# Patient Record
Sex: Female | Born: 1995 | Hispanic: No | Marital: Married | Smoking: Never smoker
Health system: Southern US, Community
[De-identification: ages and names within clinical notes are randomized; demographics above are authoritative.]

## PROBLEM LIST (undated history)

## (undated) ENCOUNTER — Inpatient Hospital Stay (HOSPITAL_COMMUNITY): Payer: Self-pay

## (undated) DIAGNOSIS — Z789 Other specified health status: Secondary | ICD-10-CM

## (undated) HISTORY — DX: Other specified health status: Z78.9

## (undated) HISTORY — PX: TONSILLECTOMY: SUR1361

---

## 2018-04-06 NOTE — L&D Delivery Note (Signed)
OB/GYN Faculty Practice Delivery Note  Marissa Reed is a 23 y.o. G1P0 s/p VD at [redacted]w[redacted]d. She was admitted for SOL/SROM with meconium.   ROM: 10h 44m with meconium-stained fluid GBS Status: Negative/-- (11/03 0000) Maximum Maternal Temperature: 98.26F  Labor Progress: . Initial SVE: 3/80/-2. Patient received Pitocin and epidural. She then progressed to complete.   Delivery Date/Time: 11/27 @ 1616 Delivery: Called to room and patient was complete and pushing. Head delivered in ROA position. No nuchal cord present. Shoulder and body delivered in usual fashion. Infant with spontaneous cry, placed on mother's abdomen, dried and stimulated. Cord clamped x 2 after 1-minute delay, and cut by FOB. Cord blood drawn. Placenta delivered spontaneously with gentle cord traction. Fundus firm with massage and Pitocin. Labia, perineum, vagina, and cervix inspected inspected with 3a perineal laceration which was repaired in a standard fashion. Dr. Rip Harbour called to assist with repair.  Baby Weight: pending  Placenta: Sent to L&D Complications: None Lacerations: 3a perineal laceration EBL: 620 mL Analgesia: Epidural   Infant: APGAR (1 MIN): 8   APGAR (5 MINS): 7   APGAR (10 MINS): 9     Barrington Ellison, MD [facility] Family Medicine Fellow, [facility] for [facility] [place] [facility] 03/03/2019, 4:59 PM

## 2018-07-18 ENCOUNTER — Telehealth: Payer: Self-pay | Admitting: General Practice

## 2018-07-18 DIAGNOSIS — O219 Vomiting of pregnancy, unspecified: Secondary | ICD-10-CM

## 2018-07-18 MED ORDER — PROMETHAZINE HCL 25 MG PO TABS: 25.0000 mg | ORAL_TABLET | Freq: Four times a day (QID) | ORAL | 0 refills | Status: DC | PRN

## 2018-07-18 NOTE — Telephone Encounter (Signed)
Patient's sister called in for the patient as she speaks arabic. She states her sister is pregnant for the first time and is currently  7 weeks. She reports her sister has a lot of nausea/vomiting and is unable to keep anything down. Phenergan sent in per protocol & directions of use provided. She verbalized understanding & had no questions.

## 2018-08-08 ENCOUNTER — Encounter: Payer: Self-pay | Admitting: Advanced Practice Midwife

## 2018-08-08 ENCOUNTER — Other Ambulatory Visit: Payer: Self-pay

## 2018-08-08 ENCOUNTER — Ambulatory Visit (INDEPENDENT_AMBULATORY_CARE_PROVIDER_SITE_OTHER): Payer: Medicaid Other | Admitting: Advanced Practice Midwife

## 2018-08-08 ENCOUNTER — Other Ambulatory Visit (HOSPITAL_COMMUNITY)
Admission: RE | Admit: 2018-08-08 | Discharge: 2018-08-08 | Disposition: A | Discharge status: Home / Self Care | Payer: Medicaid Other | Source: Ambulatory Visit | Attending: Advanced Practice Midwife | Admitting: Advanced Practice Midwife

## 2018-08-08 VITALS — BP 108/75 | HR 103 | Ht 68.0 in | Wt 199.7 lb

## 2018-08-08 DIAGNOSIS — Z3401 Encounter for supervision of normal first pregnancy, first trimester: Secondary | ICD-10-CM

## 2018-08-08 DIAGNOSIS — Z3A10 10 weeks gestation of pregnancy: Secondary | ICD-10-CM

## 2018-08-08 DIAGNOSIS — Z603 Acculturation difficulty: Secondary | ICD-10-CM | POA: Insufficient documentation

## 2018-08-08 DIAGNOSIS — O219 Vomiting of pregnancy, unspecified: Secondary | ICD-10-CM

## 2018-08-08 DIAGNOSIS — Z3490 Encounter for supervision of normal pregnancy, unspecified, unspecified trimester: Secondary | ICD-10-CM | POA: Insufficient documentation

## 2018-08-08 DIAGNOSIS — Z789 Other specified health status: Secondary | ICD-10-CM

## 2018-08-08 MED ORDER — DOXYLAMINE-PYRIDOXINE 10-10 MG PO TBEC: DELAYED_RELEASE_TABLET | ORAL | 5 refills | Status: DC

## 2018-08-08 NOTE — Patient Instructions (Signed)

## 2018-08-08 NOTE — Progress Notes (Signed)
Pt is here for initial OB visit. LMP 05/26/2018. EDD 03/02/19.

## 2018-08-08 NOTE — Progress Notes (Signed)
Subjective:   Marissa Reed is a 23 y.o. G1P0 at 8543w4d by LMP being seen today for her first obstetrical visit.  Her obstetrical history is significant for none and has Encounter for supervision of normal first pregnancy in first trimester on their problem list.. Patient does intend to breast feed. Pregnancy history fully reviewed.  Patient reports nausea.  HISTORY: OB History  Gravida Para Term Preterm AB Living  1 0 0 0 0 0  SAB TAB Ectopic Multiple Live Births  0 0 0 0 0    # Outcome Date GA Lbr Len/2nd Weight Sex Delivery Anes PTL Lv  1 Current            History reviewed. No pertinent past medical history. Past Surgical History:  Procedure Laterality Date  . TONSILLECTOMY     Family History  Problem Relation Age of Onset  . Diabetes Mother   . Diabetes Father    Social History   Tobacco Use  . Smoking status: Never Smoker  Substance Use Topics  . Alcohol use: Never    Frequency: Never  . Drug use: Never   Allergies not on file Current Outpatient Medications on File Prior to Visit  Medication Sig Dispense Refill  . promethazine (PHENERGAN) 25 MG tablet Take 1 tablet (25 mg total) by mouth every 6 (six) hours as needed for nausea or vomiting. 30 tablet 0   No current facility-administered medications on file prior to visit.      Exam   Vitals:   08/08/18 0909 08/08/18 0910  BP: 108/75   Pulse: (!) 103   Weight: 90.6 kg   Height:  5\' 8"  (1.727 m)   Fetal Heart Rate (bpm): 138  Uterus:     Pelvic Exam: Perineum: no hemorrhoids, normal perineum   Vulva: normal external genitalia, no lesions   Vagina:  normal mucosa, normal discharge   Cervix: no lesions and normal, pap smear done.    Adnexa: normal adnexa and no mass, fullness, tenderness   Bony Pelvis: average  System: General: well-developed, well-nourished female in no acute distress   Breast:  normal appearance, no masses or tenderness   Skin: normal coloration and turgor, no rashes   Neurologic: oriented, normal, negative, normal mood   Extremities: normal strength, tone, and muscle mass, ROM of all joints is normal   HEENT PERRLA, extraocular movement intact and sclera clear, anicteric   Mouth/Teeth mucous membranes moist, pharynx normal without lesions and dental hygiene good   Neck supple and no masses   Cardiovascular: regular rate and rhythm   Respiratory:  no respiratory distress, normal breath sounds   Abdomen: soft, non-tender; bowel sounds normal; no masses,  no organomegaly     Assessment:   Pregnancy: G1P0 Patient Active Problem List   Diagnosis Date Noted  . Encounter for supervision of normal first pregnancy in first trimester 08/08/2018     Plan:   1. Encounter for supervision of normal first pregnancy in first trimester --Anticipatory guidance about next visits/weeks of pregnancy given. --Reviewed safety, visitor policy, reassurance about COVID-19 for pregnancy at this time. Discussed possible changes to visits, including televisits, that may occur due to COVID-19.  The office remains open if pt needs to be seen and MAU is open 24 hours/day for OB emergencies.    - Babyscripts Schedule Optimization - Obstetric Panel, Including HIV - Culture, OB Urine - Genetic Screening - Cervicovaginal ancillary only( [place]) - Cytology - PAP( [place])  2.  Language barrier affecting health care --Arabic interpreter present with pt for visit today  3. Nausea and vomiting during pregnancy prior to [redacted] weeks gestation --Mostly in the mornings.  Discussed dietary changes.   - Doxylamine-Pyridoxine (DICLEGIS) 10-10 MG TBEC; Take 2 tabs at bedtime. If needed, add another tab in the morning. If needed, add another tab in the afternoon, up to 4 tabs/day.  Dispense: 100 tablet; Refill: 5   Initial labs drawn. Continue prenatal vitamins. Discussed and offered genetic screening options, including Quad screen/AFP, NIPS testing, and option to decline testing.  Benefits/risks/alternatives reviewed. Pt aware that anatomy US is form of genetic screening with lower accuracy in detecting trisomies than blood work.  Pt chooses genetic screening today. NIPS: ordered. Ultrasound discussed; fetal anatomic survey: requested. Problem list reviewed and updated. The nature of [facility] Faculty Practice with multiple MDs and other Advanced Practice Providers was explained to patient; also emphasized that residents, students are part of our team. Routine obstetric precautions reviewed. Return in about 4 weeks (around 09/05/2018).   Sharen Counter, CNM 08/08/18 9:34 AM

## 2018-08-09 LAB — OBSTETRIC PANEL, INCLUDING HIV
Antibody Screen: NEGATIVE
Basophils Absolute: 0 10*3/uL (ref 0.0–0.2)
Basos: 1 %
EOS (ABSOLUTE): 0 10*3/uL (ref 0.0–0.4)
Eos: 1 %
HIV Screen 4th Generation wRfx: NONREACTIVE
Hematocrit: 37.9 % (ref 34.0–46.6)
Hemoglobin: 12.9 g/dL (ref 11.1–15.9)
Hepatitis B Surface Ag: NEGATIVE
Immature Grans (Abs): 0 10*3/uL (ref 0.0–0.1)
Immature Granulocytes: 0 %
Lymphocytes Absolute: 2.2 10*3/uL (ref 0.7–3.1)
Lymphs: 33 %
MCH: 30.5 pg (ref 26.6–33.0)
MCHC: 34 g/dL (ref 31.5–35.7)
MCV: 90 fL (ref 79–97)
Monocytes Absolute: 0.6 10*3/uL (ref 0.1–0.9)
Monocytes: 9 %
Neutrophils Absolute: 3.8 10*3/uL (ref 1.4–7.0)
Neutrophils: 56 %
Platelets: 365 10*3/uL (ref 150–450)
RBC: 4.23 x10E6/uL (ref 3.77–5.28)
RDW: 14.1 % (ref 11.7–15.4)
RPR Ser Ql: NONREACTIVE
Rh Factor: POSITIVE
Rubella Antibodies, IGG: 4.13 index (ref >0.99)
WBC: 6.7 10*3/uL (ref 3.4–10.8)

## 2018-08-09 LAB — CERVICOVAGINAL ANCILLARY ONLY
Bacterial vaginitis: NEGATIVE
Candida vaginitis: NEGATIVE
Chlamydia: NEGATIVE
Neisseria Gonorrhea: NEGATIVE
Trichomonas: NEGATIVE

## 2018-08-09 LAB — CYTOLOGY - PAP

## 2018-08-10 LAB — CULTURE, OB URINE

## 2018-08-10 LAB — URINE CULTURE, OB REFLEX

## 2018-08-11 ENCOUNTER — Encounter: Payer: Self-pay | Admitting: Advanced Practice Midwife

## 2018-08-12 ENCOUNTER — Encounter: Payer: Self-pay | Admitting: Advanced Practice Midwife

## 2018-08-15 ENCOUNTER — Encounter: Payer: Self-pay | Admitting: Advanced Practice Midwife

## 2018-08-16 ENCOUNTER — Encounter: Payer: Self-pay | Admitting: Advanced Practice Midwife

## 2018-08-20 ENCOUNTER — Encounter: Payer: Self-pay | Admitting: Advanced Practice Midwife

## 2018-09-05 ENCOUNTER — Other Ambulatory Visit: Payer: Self-pay

## 2018-09-05 ENCOUNTER — Ambulatory Visit (INDEPENDENT_AMBULATORY_CARE_PROVIDER_SITE_OTHER): Payer: Medicaid Other | Admitting: Advanced Practice Midwife

## 2018-09-05 VITALS — BP 91/59

## 2018-09-05 DIAGNOSIS — Z3A14 14 weeks gestation of pregnancy: Secondary | ICD-10-CM

## 2018-09-05 DIAGNOSIS — Z3402 Encounter for supervision of normal first pregnancy, second trimester: Secondary | ICD-10-CM

## 2018-09-05 DIAGNOSIS — Z789 Other specified health status: Secondary | ICD-10-CM

## 2018-09-05 DIAGNOSIS — Z3401 Encounter for supervision of normal first pregnancy, first trimester: Secondary | ICD-10-CM

## 2018-09-05 NOTE — Progress Notes (Signed)
Pt is on the phone preparing for televisit with provider.

## 2018-09-05 NOTE — Progress Notes (Signed)
   TELEHEALTH VIRTUAL OBSTETRICS VISIT ENCOUNTER NOTE  I connected with Marissa Reed on 09/05/18 at  1:15 PM EDT by telephone at home and verified that I am speaking with the correct person using two identifiers.   I discussed the limitations, risks, security and privacy concerns of performing an evaluation and management service by telephone and the availability of in person appointments. I also discussed with the patient that there may be a patient responsible charge related to this service. The patient expressed understanding and agreed to proceed.  Subjective:  Marissa Reed is a 23 y.o. G1P0 at [redacted]w[redacted]d being followed for ongoing prenatal care.  She is currently monitored for the following issues for this low-risk pregnancy and has Encounter for supervision of normal first pregnancy in first trimester and Language barrier affecting health care on their problem list.  Patient reports no complaints. Reports fetal movement. Denies any contractions, bleeding or leaking of fluid.   The following portions of the patient's history were reviewed and updated as appropriate: allergies, current medications, past family history, past medical history, past social history, past surgical history and problem list.   Objective:   General:  Alert, oriented and cooperative.   Mental Status: Normal mood and affect perceived. Normal judgment and thought content.  Rest of physical exam deferred due to type of encounter  Assessment and Plan:  Pregnancy: G1P0 at [redacted]w[redacted]d 1. Encounter for supervision of normal first pregnancy in first trimester --Pt denies cramping, LOF, or vaginal bleeding --Anticipatory guidance about next visits/weeks of pregnancy given. --Pt is doing well but prefers another visit in the office soon for reassurance.  [place] and pt next prenatal visit to be in person visits. Pt states understanding.   --Pt with questions about low BP, she denies any dizziness or other symptoms. Reassurance provided  that 90s/50s can be normal in early pregnancy but to let us know if she becomes symptomatic. --Answered questions about PNV, importance of iron, folic acid, and DHA.  Also discussed importance of healthy balanced diet. Pt states understanding.   - [place] MFM OB COMP + 14 WK; Future  2. Language barrier affecting health care --Arabic interpreter used.  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  Preferred to remain on phone for today's visit and not use video conference. Will consider video visits in the future.  Preterm labor symptoms and general obstetric precautions including but not limited to vaginal bleeding, contractions, leaking of fluid and fetal movement were reviewed in detail with the patient.  I discussed the assessment and treatment plan with the patient. The patient was provided an opportunity to ask questions and all were answered. The patient agreed with the plan and demonstrated an understanding of the instructions. The patient was advised to call back or seek an in-person office evaluation/go to MAU at [facility] for any urgent or concerning symptoms. Please refer to After Visit Summary for other counseling recommendations.   I provided 15 minutes of non-face-to-face time during this encounter.  Return in about 5 weeks (around 10/10/2018).  No future appointments.  Sharen Counter, CNM Center for [employer], [facility]

## 2018-09-27 ENCOUNTER — Other Ambulatory Visit: Payer: Self-pay

## 2018-09-27 ENCOUNTER — Ambulatory Visit (INDEPENDENT_AMBULATORY_CARE_PROVIDER_SITE_OTHER): Payer: Medicaid Other

## 2018-09-27 VITALS — BP 91/65 | HR 108 | Wt 203.0 lb

## 2018-09-27 DIAGNOSIS — Z3401 Encounter for supervision of normal first pregnancy, first trimester: Secondary | ICD-10-CM

## 2018-09-27 NOTE — Progress Notes (Signed)
Presented for Blood Pressure Check and AFP; CBC Labs.  Subjective:  Marissa Reed is a 23 y.o. female here for BP check.   Hypertension ROS: taking medications as instructed, no medication side effects noted, no TIA's, no chest pain on exertion, no dyspnea on exertion and no swelling of ankles.    Objective:  BP 91/65   Pulse (!) 108   Wt 203 lb (92.1 kg)   LMP 05/26/2018 (Exact Date)   BMI 30.87 kg/m   Appearance alert, well appearing, and in no distress. General exam BP noted to be well controlled today in office.    Assessment:   Blood Pressure well controlled.   Plan:  Current treatment plan is effective, no change in therapy.per Dr. Jodi Mourning.

## 2018-09-29 LAB — CBC
Hematocrit: 35.7 % (ref 34.0–46.6)
Hemoglobin: 12.1 g/dL (ref 11.1–15.9)
MCH: 30.4 pg (ref 26.6–33.0)
MCHC: 33.9 g/dL (ref 31.5–35.7)
MCV: 90 fL (ref 79–97)
Platelets: 311 10*3/uL (ref 150–450)
RBC: 3.98 x10E6/uL (ref 3.77–5.28)
RDW: 14.2 % (ref 11.7–15.4)
WBC: 6.6 10*3/uL (ref 3.4–10.8)

## 2018-09-29 LAB — AFP, SERUM, OPEN SPINA BIFIDA
AFP MoM: 1.08
AFP Value: 35.5 ng/mL
Gest. Age on Collection Date: 17.5 weeks
Maternal Age At EDD: 21.6 yr
OSBR Risk 1 IN: 9540
Test Results:: NEGATIVE
Weight: 203 [lb_av]

## 2018-10-06 ENCOUNTER — Ambulatory Visit (HOSPITAL_COMMUNITY): Payer: Medicaid Other

## 2018-10-10 ENCOUNTER — Ambulatory Visit (INDEPENDENT_AMBULATORY_CARE_PROVIDER_SITE_OTHER): Payer: Medicaid Other | Admitting: Obstetrics & Gynecology

## 2018-10-10 ENCOUNTER — Encounter: Payer: Self-pay | Admitting: Obstetrics & Gynecology

## 2018-10-10 ENCOUNTER — Other Ambulatory Visit: Payer: Self-pay

## 2018-10-10 ENCOUNTER — Ambulatory Visit (HOSPITAL_COMMUNITY): Payer: Medicaid Other

## 2018-10-10 DIAGNOSIS — Z3A19 19 weeks gestation of pregnancy: Secondary | ICD-10-CM

## 2018-10-10 DIAGNOSIS — Z3402 Encounter for supervision of normal first pregnancy, second trimester: Secondary | ICD-10-CM

## 2018-10-10 NOTE — Patient Instructions (Signed)
Return to office for any scheduled appointments. Call the office or go to the [facility] at [facility] [place] if:  You begin to have strong, frequent contractions  Your water breaks.  Sometimes it is a big gush of fluid, sometimes it is just a trickle that keeps getting your panties wet or running down your legs  You have vaginal bleeding.  It is normal to have a small amount of spotting if your cervix was checked.   You do not feel your baby moving like normal.  If you do not, get something to eat and drink and lay down and focus on feeling your baby move.   If your baby is still not moving like normal, you should call the office or go to MAU.  Any other obstetric concerns.   

## 2018-10-10 NOTE — Progress Notes (Signed)
    TELEHEALTH VIRTUAL OBSTETRICS VISIT ENCOUNTER NOTE  I connected with Marissa Reed on 10/10/18 at 10:00 AM EDT by telephone at home and verified that I am speaking with the correct person using two identifiers. Due to language barrier, an Arabic phone interpreter was used during this encounter.  Patient was unable to do a video visit given that interpreter could not connect to WebEx.    I discussed the limitations, risks, security and privacy concerns of performing an evaluation and management service by telephone and the availability of in person appointments. I also discussed with the patient that there may be a patient responsible charge related to this service. The patient expressed understanding and agreed to proceed.  Subjective:  Marissa Reed is a 23 y.o. G1P0 at [redacted]w[redacted]d being followed for ongoing prenatal care.  She is currently monitored for the following issues for this low-risk pregnancy and has Supervision of normal pregnancy and Language barrier affecting health care on their problem list.  Patient reports being very worried that she cannot feel her baby move. Anatomy scan is on 10/21/18. She is really concerned . Reports fetal movement. Denies any contractions, bleeding or leaking of fluid.   The following portions of the patient's history were reviewed and updated as appropriate: allergies, current medications, past family history, past medical history, past social history, past surgical history and problem list.   Objective:   General:  Alert, oriented and cooperative.   Mental Status: Normal mood and affect perceived. Normal judgment and thought content.  Rest of physical exam deferred due to type of encounter  Assessment and Plan:  Pregnancy: G1P0 at [redacted]w[redacted]d 1. Encounter for supervision of normal first pregnancy in second trimester Reviewed all initial OB labs, patient reassured. Repeat pap needed later in pregnancy or postpartum given unsatisfactory specimen. Reassured  patient that she was still early and may not feel FM yet, but she was very concerned. Offered for her to come in for [facility] check for reassurance, she wants to come in tomorrow monring. RN appointment made for her at [place]. Patient was very grateful for this. No other complaints or concerns.  Routine obstetric precautions reviewed.  I discussed the assessment and treatment plan with the patient. The patient was provided an opportunity to ask questions and all were answered. The patient agreed with the plan and demonstrated an understanding of the instructions. The patient was advised to call back or seek an in-person office evaluation/go to MAU at [facility] for any urgent or concerning symptoms. Please refer to After Visit Summary for other counseling recommendations.   I provided 15 minutes of non-face-to-face time during this encounter.  Return in about 4 weeks (around 11/07/2018) for Virtual OB Visit (WebEx, needs Arabic interpreter) *coming in 10/11/18 for RN FHR check via doppler* .  Future Appointments  Date Time Provider [place]  10/11/2018  8:45 AM [place] None  10/21/2018  3:15 PM [place]    Verita Schneiders, MD Center for [place], [place]

## 2018-10-10 NOTE — Progress Notes (Signed)
Virtual Visit via Telephone Note  I connected with Marissa Reed on 10/10/18 at 10:00 AM EDT by telephone and verified that I am speaking with the correct person using two identifiers. Used translator

## 2018-10-11 ENCOUNTER — Other Ambulatory Visit: Payer: Self-pay

## 2018-10-11 ENCOUNTER — Ambulatory Visit (INDEPENDENT_AMBULATORY_CARE_PROVIDER_SITE_OTHER): Payer: Medicaid Other

## 2018-10-11 DIAGNOSIS — Z3492 Encounter for supervision of normal pregnancy, unspecified, second trimester: Secondary | ICD-10-CM

## 2018-10-11 DIAGNOSIS — Z3490 Encounter for supervision of normal pregnancy, unspecified, unspecified trimester: Secondary | ICD-10-CM

## 2018-10-11 NOTE — Progress Notes (Signed)
Pt is here for FHR doppler for reassurance. She reported she does not feel fetal movement during her virtual visit yesterday with the provider, although the provider reassured her this can be normal at [redacted] weeks gestation- the patient felt more comfortable hearing the FHR in office. FHR today was 150, pt is reassured.

## 2018-10-21 ENCOUNTER — Ambulatory Visit (HOSPITAL_COMMUNITY): Payer: Medicaid Other

## 2018-10-25 ENCOUNTER — Ambulatory Visit (HOSPITAL_COMMUNITY)
Admission: RE | Admit: 2018-10-25 | Discharge: 2018-10-25 | Disposition: A | Discharge status: Home / Self Care | Payer: Medicaid Other | Source: Ambulatory Visit | Attending: Obstetrics and Gynecology | Admitting: Obstetrics and Gynecology

## 2018-10-25 ENCOUNTER — Other Ambulatory Visit (HOSPITAL_COMMUNITY): Payer: Self-pay | Admitting: *Deleted

## 2018-10-25 ENCOUNTER — Other Ambulatory Visit: Payer: Self-pay

## 2018-10-25 DIAGNOSIS — Z3A21 21 weeks gestation of pregnancy: Secondary | ICD-10-CM

## 2018-10-25 DIAGNOSIS — Z3401 Encounter for supervision of normal first pregnancy, first trimester: Secondary | ICD-10-CM

## 2018-10-25 DIAGNOSIS — Z363 Encounter for antenatal screening for malformations: Secondary | ICD-10-CM

## 2018-10-25 DIAGNOSIS — Z362 Encounter for other antenatal screening follow-up: Secondary | ICD-10-CM

## 2018-10-27 NOTE — Progress Notes (Signed)
Patient ID: Marissa Reed, female   DOB: 11/22/1995, 24 y.o.   MRN: [id number] Patient seen and assessed by nursing staff during this encounter. I have reviewed the chart and agree with the documentation and plan.  Emeterio Reeve, MD 10/27/2018 11:09 AM

## 2018-11-07 ENCOUNTER — Ambulatory Visit (INDEPENDENT_AMBULATORY_CARE_PROVIDER_SITE_OTHER): Payer: Medicaid Other | Admitting: Obstetrics and Gynecology

## 2018-11-07 ENCOUNTER — Encounter: Payer: Self-pay | Admitting: Obstetrics and Gynecology

## 2018-11-07 DIAGNOSIS — Z3492 Encounter for supervision of normal pregnancy, unspecified, second trimester: Secondary | ICD-10-CM

## 2018-11-07 DIAGNOSIS — Z603 Acculturation difficulty: Secondary | ICD-10-CM

## 2018-11-07 DIAGNOSIS — Z3A23 23 weeks gestation of pregnancy: Secondary | ICD-10-CM

## 2018-11-07 DIAGNOSIS — Z789 Other specified health status: Secondary | ICD-10-CM

## 2018-11-07 DIAGNOSIS — Z3490 Encounter for supervision of normal pregnancy, unspecified, unspecified trimester: Secondary | ICD-10-CM

## 2018-11-07 NOTE — Progress Notes (Signed)
S/w pt to prepare for virtual visit. Pt reports fetal movement, denies pain. Pt states that BP cuff needs batteries and she is unable to take it today.

## 2018-11-07 NOTE — Progress Notes (Signed)
   [place] VIRTUAL VIDEO VISIT ENCOUNTER NOTE  Provider location: Center for [place] at [place]   I connected with Charlyne Quale on 11/07/18 at 10:15 AM EDT by WebEx Video Encounter at home and verified that I am speaking with the correct person using two identifiers.   I discussed the limitations, risks, security and privacy concerns of performing an evaluation and management service virtually and the availability of in person appointments. I also discussed with the patient that there may be a patient responsible charge related to this service. The patient expressed understanding and agreed to proceed. Subjective:  Tonni Mansour is a 23 y.o. G1P0 at [redacted]w[redacted]d being seen today for ongoing prenatal care.  She is currently monitored for the following issues for this low-risk pregnancy and has Supervision of normal pregnancy and Language barrier affecting health care on their problem list.  Patient reports no complaints.  Contractions: Not present. Vag. Bleeding: None.  Movement: Present. Denies any leaking of fluid.   The following portions of the patient's history were reviewed and updated as appropriate: allergies, current medications, past family history, past medical history, past social history, past surgical history and problem list.   Objective:  There were no vitals filed for this visit.  Fetal Status:     Movement: Present     General:  Alert, oriented and cooperative. Patient is in no acute distress.  Respiratory: Normal respiratory effort, no problems with respiration noted  Mental Status: Normal mood and affect. Normal behavior. Normal judgment and thought content.  Rest of physical exam deferred due to type of encounter  Assessment and Plan:  Pregnancy: G1P0 at [redacted]w[redacted]d  1. Encounter for supervision of normal pregnancy, antepartum, unspecified gravidity - Reviewed anatomy US, has follow up 4 weeks for subotipmal views - Next visit 2 hr GTT  2.  Language barrier affecting health care Arabic translator used   Preterm labor symptoms and general obstetric precautions including but not limited to vaginal bleeding, contractions, leaking of fluid and fetal movement were reviewed in detail with the patient. I discussed the assessment and treatment plan with the patient. The patient was provided an opportunity to ask questions and all were answered. The patient agreed with the plan and demonstrated an understanding of the instructions. The patient was advised to call back or seek an in-person office evaluation/go to MAU at [facility] for any urgent or concerning symptoms. Please refer to After Visit Summary for other counseling recommendations.   I provided 15 minutes of face-to-face time during this encounter.  Return in about 4 weeks (around 12/05/2018) for 2 hr GTT, 3rd trim labs, OB visit, in person.  Future Appointments  Date Time Provider [place]  11/22/2018 10:45 AM [facility] [place] 2 [facility]-MFCUS M[facility]-US    Geraldyne Barraclough M Richardine Peppers, [place] for [place], [place]

## 2018-11-17 ENCOUNTER — Other Ambulatory Visit: Payer: Self-pay

## 2018-11-17 ENCOUNTER — Inpatient Hospital Stay (HOSPITAL_COMMUNITY)
Admission: AD | Admit: 2018-11-17 | Discharge: 2018-11-17 | Disposition: A | Discharge status: Home / Self Care | Payer: Medicaid Other | Attending: Obstetrics & Gynecology | Admitting: Obstetrics & Gynecology

## 2018-11-17 ENCOUNTER — Encounter (HOSPITAL_COMMUNITY): Payer: Self-pay

## 2018-11-17 ENCOUNTER — Telehealth: Payer: Self-pay | Admitting: *Deleted

## 2018-11-17 DIAGNOSIS — O368130 Decreased fetal movements, third trimester, not applicable or unspecified: Secondary | ICD-10-CM

## 2018-11-17 DIAGNOSIS — O368120 Decreased fetal movements, second trimester, not applicable or unspecified: Secondary | ICD-10-CM | POA: Insufficient documentation

## 2018-11-17 DIAGNOSIS — Z3689 Encounter for other specified antenatal screening: Secondary | ICD-10-CM | POA: Diagnosis not present

## 2018-11-17 DIAGNOSIS — Z3A25 25 weeks gestation of pregnancy: Secondary | ICD-10-CM | POA: Insufficient documentation

## 2018-11-17 DIAGNOSIS — Z3490 Encounter for supervision of normal pregnancy, unspecified, unspecified trimester: Secondary | ICD-10-CM

## 2018-11-17 NOTE — Telephone Encounter (Signed)
Telephone call to patient using [facility] # [phone].  Patient states she has not felt baby move since last night.  I advised patient to go directly to the [facility] and [place] to be evaluated.  Patient stated she did not have a ride right now.  Emphasized the importance that she be evaluated as soon as possible.

## 2018-11-17 NOTE — Discharge Instructions (Signed)

## 2018-11-17 NOTE — MAU Note (Signed)
Reports decreased fetal movement last 2 days.  Denies vag bleeding, leaking or any pain.

## 2018-11-17 NOTE — MAU Provider Note (Signed)
History     CSN: [id number]  Arrival date and time: 11/17/18 1310   First Provider Initiated Contact with Patient 11/17/18 1345      Chief Complaint  Patient presents with  . Decreased Fetal Movement   HPI  Ms. Nydia BoutonSalma Girardot is a 23 y.o. G1P0 at 64110w0d who presents to MAU today with complaint of decreased fetal movement x 2 days. The patient states that she is still feeling some movement every day. She denies contractions, vaginal bleeding, LOF or complications with the pregnancy.    OB History    Gravida  1   Para      Term      Preterm      AB      Living        SAB      TAB      Ectopic      Multiple      Live Births              No past medical history on file.  Past Surgical History:  Procedure Laterality Date  . TONSILLECTOMY      Family History  Problem Relation Age of Onset  . Diabetes Mother   . Diabetes Father     Social History   Tobacco Use  . Smoking status: Never Smoker  . Smokeless tobacco: Never Used  Substance Use Topics  . Alcohol use: Never    Frequency: Never  . Drug use: Never    Allergies: No Known Allergies  Medications Prior to Admission  Medication Sig Dispense Refill Last Dose  . Prenatal Vit w/Fe-Methylfol-FA (PNV PO) Take by mouth.   11/16/2018 at Unknown time  . Doxylamine-Pyridoxine (DICLEGIS) 10-10 MG TBEC Take 2 tabs at bedtime. If needed, add another tab in the morning. If needed, add another tab in the afternoon, up to 4 tabs/day. 100 tablet 5   . promethazine (PHENERGAN) 25 MG tablet Take 1 tablet (25 mg total) by mouth every 6 (six) hours as needed for nausea or vomiting. (Patient not taking: Reported on 11/07/2018) 30 tablet 0     Review of Systems  Constitutional: Negative for fever.  Gastrointestinal: Negative for abdominal pain.  Genitourinary: Negative for vaginal bleeding and vaginal discharge.   Physical Exam   Blood pressure 109/69, pulse 95, temperature 99.4 F (37.4 C), temperature source  Oral, resp. rate 18, last menstrual period 05/26/2018, SpO2 99 %.  Physical Exam  Nursing note and vitals reviewed. Constitutional: She is oriented to person, place, and time. She appears well-developed and well-nourished. No distress.  HENT:  Head: Normocephalic and atraumatic.  Cardiovascular: Normal rate.  Respiratory: Effort normal.  GI: Soft. She exhibits no distension and no mass. There is no abdominal tenderness. There is no rebound and no guarding.  Neurological: She is alert and oriented to person, place, and time.  Skin: Skin is warm and dry. No erythema.  Psychiatric: She has a normal mood and affect.     Fetal Monitoring: Baseline: 140 bpm Variability: moderate Accelerations: 10 x 10 Decelerations: none Contractions: none   MAU Course  Procedures None   MDM NST today - reassuring for GA Patient is reassured of normal FM pattern for this GA and acknowledges feeling normal movement while in MAU Assessment and Plan  A: SIUP at 39110w0d Normal fetal movement NST reassuring for GA    P: Discharge home Warning signs for worsening condition discussed Patient advised to follow-up with [facility] as scheduled for routine prenatal  care  Patient may return to MAU as needed or if her condition were to change or worsen  Kerry Hough, PA-C 11/17/2018, 1:52 PM

## 2018-11-22 ENCOUNTER — Ambulatory Visit (HOSPITAL_COMMUNITY)
Admission: RE | Admit: 2018-11-22 | Discharge: 2018-11-22 | Disposition: A | Discharge status: Home / Self Care | Payer: Medicaid Other | Source: Ambulatory Visit | Attending: Maternal & Fetal Medicine | Admitting: Maternal & Fetal Medicine

## 2018-11-30 ENCOUNTER — Ambulatory Visit (HOSPITAL_COMMUNITY)
Admission: RE | Admit: 2018-11-30 | Discharge: 2018-11-30 | Disposition: A | Discharge status: Home / Self Care | Payer: Medicaid Other | Source: Ambulatory Visit | Attending: Maternal & Fetal Medicine | Admitting: Maternal & Fetal Medicine

## 2018-11-30 ENCOUNTER — Other Ambulatory Visit: Payer: Self-pay

## 2018-11-30 DIAGNOSIS — Z362 Encounter for other antenatal screening follow-up: Secondary | ICD-10-CM | POA: Diagnosis present

## 2018-11-30 DIAGNOSIS — Z3A26 26 weeks gestation of pregnancy: Secondary | ICD-10-CM | POA: Diagnosis not present

## 2018-12-07 ENCOUNTER — Other Ambulatory Visit: Payer: Medicaid Other

## 2018-12-07 ENCOUNTER — Encounter: Payer: Medicaid Other | Admitting: Certified Nurse Midwife

## 2018-12-16 ENCOUNTER — Other Ambulatory Visit: Payer: Medicaid Other

## 2018-12-16 ENCOUNTER — Encounter: Payer: Self-pay | Admitting: Obstetrics

## 2018-12-16 ENCOUNTER — Other Ambulatory Visit: Payer: Self-pay

## 2018-12-16 ENCOUNTER — Encounter: Payer: Self-pay | Admitting: Nurse Practitioner

## 2018-12-16 ENCOUNTER — Ambulatory Visit (INDEPENDENT_AMBULATORY_CARE_PROVIDER_SITE_OTHER): Payer: Medicaid Other | Admitting: Nurse Practitioner

## 2018-12-16 ENCOUNTER — Encounter: Payer: Medicaid Other | Admitting: Nurse Practitioner

## 2018-12-16 DIAGNOSIS — Z3490 Encounter for supervision of normal pregnancy, unspecified, unspecified trimester: Secondary | ICD-10-CM

## 2018-12-16 DIAGNOSIS — Z3492 Encounter for supervision of normal pregnancy, unspecified, second trimester: Secondary | ICD-10-CM

## 2018-12-16 DIAGNOSIS — Z3A29 29 weeks gestation of pregnancy: Secondary | ICD-10-CM

## 2018-12-16 MED ORDER — PROMETHAZINE HCL 25 MG PO TABS: 25.0000 mg | ORAL_TABLET | Freq: Four times a day (QID) | ORAL | 0 refills | Status: DC | PRN

## 2018-12-16 NOTE — Progress Notes (Signed)
Patient reports fetal movement, denies pain. 

## 2018-12-16 NOTE — Patient Instructions (Signed)

## 2018-12-16 NOTE — Progress Notes (Signed)
    Subjective:  Marissa Reed is a 23 y.o. G1P0 at [redacted]w[redacted]d being seen today for ongoing prenatal care.  She is currently monitored for the following issues for this low-risk pregnancy and has Supervision of normal pregnancy and Language barrier affecting health care on their problem list.  Patient reports no complaints.  Contractions: Not present. Vag. Bleeding: None.  Movement: Present. Denies leaking of fluid.   The following portions of the patient's history were reviewed and updated as appropriate: allergies, current medications, past family history, past medical history, past social history, past surgical history and problem list. Problem list updated.  Objective:   Vitals:   12/16/18 0852  BP: 103/71  Pulse: 97  Weight: 224 lb 12.8 oz (102 kg)    Fetal Status: Fetal Heart Rate (bpm): 145 Fundal Height: 27 cm Movement: Present     General:  Alert, oriented and cooperative. Patient is in no acute distress.  Skin: Skin is warm and dry. No rash noted.   Cardiovascular: Normal heart rate noted  Respiratory: Normal respiratory effort, no problems with respiration noted  Abdomen: Soft, gravid, appropriate for gestational age. Pain/Pressure: Absent     Pelvic:  Cervical exam deferred        Extremities: Normal range of motion.  Edema: None  Mental Status: Normal mood and affect. Normal behavior. Normal judgment and thought content.   Urinalysis:      Assessment and Plan:  Pregnancy: G1P0 at [redacted]w[redacted]d  1. Encounter for supervision of normal pregnancy, antepartum, unspecified gravidity Clietn asked about ultrasound results - of note - EFW dropped from 51% at her eariler [place] to 26% at 26 weeks.  Additionally she has had one visit at MAU for decreased fetal movement at 25 weeks.  Baby was doing well then but with added drop in percentile, would recommend getting another ultrasound at 32 weeks - ordered  Did not complete GTT today due to vomiting.  - Glucose Tolerance, 2 Hours w/1 Hour -  RPR - CBC - HIV antibody (with reflex)  Preterm labor symptoms and general obstetric precautions including but not limited to vaginal bleeding, contractions, leaking of fluid and fetal movement were reviewed in detail with the patient. Please refer to After Visit Summary for other counseling recommendations.  Return in about 2 weeks (around 12/30/2018) for virtual visit - WebEx with interpreter.  Return sooner for 1 hour glucola and use Phenergan prescribed to avoid vomiting with the glucola.  Earlie Server, RN, MSN, NP-BC Nurse Practitioner, [facility] for [employer], [place] [employer] 12/16/2018 12:42 PM

## 2018-12-23 ENCOUNTER — Other Ambulatory Visit: Payer: Medicaid Other

## 2018-12-23 ENCOUNTER — Other Ambulatory Visit: Payer: Self-pay

## 2018-12-23 DIAGNOSIS — Z3490 Encounter for supervision of normal pregnancy, unspecified, unspecified trimester: Secondary | ICD-10-CM

## 2018-12-24 LAB — CBC
Hematocrit: 34.3 % (ref 34.0–46.6)
Hemoglobin: 11.6 g/dL (ref 11.1–15.9)
MCH: 31 pg (ref 26.6–33.0)
MCHC: 33.8 g/dL (ref 31.5–35.7)
MCV: 92 fL (ref 79–97)
Platelets: 217 10*3/uL (ref 150–450)
RBC: 3.74 x10E6/uL — ABNORMAL LOW (ref 3.77–5.28)
RDW: 12.9 % (ref 11.7–15.4)
WBC: 7.2 10*3/uL (ref 3.4–10.8)

## 2018-12-24 LAB — GLUCOSE TOLERANCE, 1 HOUR: Glucose, 1Hr PP: 146 mg/dL (ref 65–199)

## 2018-12-24 LAB — RPR: RPR Ser Ql: NONREACTIVE

## 2018-12-24 LAB — HIV ANTIBODY (ROUTINE TESTING W REFLEX): HIV Screen 4th Generation wRfx: NONREACTIVE

## 2018-12-25 ENCOUNTER — Encounter: Payer: Self-pay | Admitting: Obstetrics and Gynecology

## 2018-12-25 DIAGNOSIS — O99810 Abnormal glucose complicating pregnancy: Secondary | ICD-10-CM | POA: Insufficient documentation

## 2018-12-29 ENCOUNTER — Encounter: Payer: Self-pay | Admitting: Obstetrics

## 2018-12-29 ENCOUNTER — Other Ambulatory Visit: Payer: Medicaid Other

## 2018-12-29 ENCOUNTER — Other Ambulatory Visit: Payer: Self-pay

## 2018-12-29 DIAGNOSIS — Z3490 Encounter for supervision of normal pregnancy, unspecified, unspecified trimester: Secondary | ICD-10-CM

## 2018-12-29 NOTE — Progress Notes (Unsigned)
3

## 2018-12-30 ENCOUNTER — Telehealth: Payer: Self-pay | Admitting: Medical

## 2018-12-30 ENCOUNTER — Ambulatory Visit (INDEPENDENT_AMBULATORY_CARE_PROVIDER_SITE_OTHER): Payer: Medicaid Other | Admitting: Medical

## 2018-12-30 ENCOUNTER — Encounter: Payer: Self-pay | Admitting: Medical

## 2018-12-30 VITALS — BP 91/68 | HR 110

## 2018-12-30 DIAGNOSIS — Z3493 Encounter for supervision of normal pregnancy, unspecified, third trimester: Secondary | ICD-10-CM | POA: Diagnosis not present

## 2018-12-30 DIAGNOSIS — Z3A31 31 weeks gestation of pregnancy: Secondary | ICD-10-CM

## 2018-12-30 DIAGNOSIS — Z3490 Encounter for supervision of normal pregnancy, unspecified, unspecified trimester: Secondary | ICD-10-CM

## 2018-12-30 DIAGNOSIS — Z789 Other specified health status: Secondary | ICD-10-CM

## 2018-12-30 DIAGNOSIS — O99810 Abnormal glucose complicating pregnancy: Secondary | ICD-10-CM

## 2018-12-30 LAB — GESTATIONAL GLUCOSE TOLERANCE
Glucose, Fasting: 91 mg/dL (ref 65–94)
Glucose, GTT - 1 Hour: 160 mg/dL (ref 65–179)
Glucose, GTT - 2 Hour: 114 mg/dL (ref 65–154)
Glucose, GTT - 3 Hour: 133 mg/dL (ref 65–139)

## 2018-12-30 NOTE — Progress Notes (Signed)
3 hour pending  Will change follow-up if failed 3 hour  Needs repeat pap    TELEHEALTH VIRTUAL OBSTETRICS VISIT ENCOUNTER NOTE  I connected with Marissa Reed on 12/30/18 at  9:55 AM EDT by telephone at home and verified that I am speaking with the correct person using two identifiers.   I discussed the limitations, risks, security and privacy concerns of performing an evaluation and management service by telephone and the availability of in person appointments. I also discussed with the patient that there may be a patient responsible charge related to this service. The patient expressed understanding and agreed to proceed.  Subjective:  Marissa Reed is a 23 y.o. G1P0 at [redacted]w[redacted]d being followed for ongoing prenatal care.  She is currently monitored for the following issues for this high-risk pregnancy and has Supervision of normal pregnancy; Language barrier affecting health care; and Abnormal glucose in pregnancy, antepartum on their problem list.  Patient reports no complaints. Reports fetal movement. Denies any contractions, bleeding or leaking of fluid.   The following portions of the patient's history were reviewed and updated as appropriate: allergies, current medications, past family history, past medical history, past social history, past surgical history and problem list.   Objective:   General:  Alert, oriented and cooperative.   Mental Status: Normal mood and affect perceived. Normal judgment and thought content.  Rest of physical exam deferred due to type of encounter  Assessment and Plan:  Pregnancy: G1P0 at [redacted]w[redacted]d 1. Encounter for supervision of normal pregnancy, antepartum, unspecified gravidity - Doing well, no complaints  - Follow-up US scheduled 01/30/19  2. Language barrier affecting health care - [employer] interpreters used   3. Abnormal glucose in pregnancy, antepartum - 3 hour drawn yesterday, results pending  - Patient will be contacted with results when available  and will need to be removed from baby Rx optimized scheduling if GDM  Preterm labor symptoms and general obstetric precautions including but not limited to vaginal bleeding, contractions, leaking of fluid and fetal movement were reviewed in detail with the patient.  I discussed the assessment and treatment plan with the patient. The patient was provided an opportunity to ask questions and all were answered. The patient agreed with the plan and demonstrated an understanding of the instructions. The patient was advised to call back or seek an in-person office evaluation/go to [facility]U at [facility] for any urgent or concerning symptoms. Please refer to After Visit Summary for other counseling recommendations.   I provided 10 minutes of non-face-to-face time during this encounter.  Return in about 4 weeks (around 01/27/2019) for [place], In-Person.  Future Appointments  Date Time Provider [place]  01/30/2019 10:45 AM WH-MFC [place] 2 WH-MFCUS MFC-US    Kerry Hough, [place] for [employer], [place]

## 2018-12-30 NOTE — Telephone Encounter (Signed)
Used [employer] to call patient with normal 3 hour GTT results. Patient voiced understanding and will plan to keep upcoming appointments as previously scheduled.   Luvenia Redden, PA-C 12/30/2018 2:04 PM

## 2018-12-30 NOTE — Patient Instructions (Signed)
Braxton Hicks Contractions Contractions of the uterus can occur throughout pregnancy, but they are not always a sign that you are in labor. You may have practice contractions called Braxton Hicks contractions. These false labor contractions are sometimes confused with true labor. What are Braxton Hicks contractions? Braxton Hicks contractions are tightening movements that occur in the muscles of the uterus before labor. Unlike true labor contractions, these contractions do not result in opening (dilation) and thinning of the cervix. Toward the end of pregnancy (32-34 weeks), Braxton Hicks contractions can happen more often and may become stronger. These contractions are sometimes difficult to tell apart from true labor because they can be very uncomfortable. You should not feel embarrassed if you go to the hospital with false labor. Sometimes, the only way to tell if you are in true labor is for your health care provider to look for changes in the cervix. The health care provider will do a physical exam and may monitor your contractions. If you are not in true labor, the exam should show that your cervix is not dilating and your water has not broken. If there are no other health problems associated with your pregnancy, it is completely safe for you to be sent home with false labor. You may continue to have Braxton Hicks contractions until you go into true labor. How to tell the difference between true labor and false labor True labor  Contractions last 30-70 seconds.  Contractions become very regular.  Discomfort is usually felt in the top of the uterus, and it spreads to the lower abdomen and low back.  Contractions do not go away with walking.  Contractions usually become more intense and increase in frequency.  The cervix dilates and gets thinner. False labor  Contractions are usually shorter and not as strong as true labor contractions.  Contractions are usually irregular.  Contractions  are often felt in the front of the lower abdomen and in the groin.  Contractions may go away when you walk around or change positions while lying down.  Contractions get weaker and are shorter-lasting as time goes on.  The cervix usually does not dilate or become thin. Follow these instructions at home:   Take over-the-counter and prescription medicines only as told by your health care provider.  Keep up with your usual exercises and follow other instructions from your health care provider.  Eat and drink lightly if you think you are going into labor.  If Braxton Hicks contractions are making you uncomfortable: ? Change your position from lying down or resting to walking, or change from walking to resting. ? Sit and rest in a tub of warm water. ? Drink enough fluid to keep your urine pale yellow. Dehydration may cause these contractions. ? Do slow and deep breathing several times an hour.  Keep all follow-up prenatal visits as told by your health care provider. This is important. Contact a health care provider if:  You have a fever.  You have continuous pain in your abdomen. Get help right away if:  Your contractions become stronger, more regular, and closer together.  You have fluid leaking or gushing from your vagina.  You pass blood-tinged mucus (bloody show).  You have bleeding from your vagina.  You have low back pain that you never had before.  You feel your baby's head pushing down and causing pelvic pressure.  Your baby is not moving inside you as much as it used to. Summary  Contractions that occur before labor are   called Braxton Hicks contractions, false labor, or practice contractions.  Braxton Hicks contractions are usually shorter, weaker, farther apart, and less regular than true labor contractions. True labor contractions usually become progressively stronger and regular, and they become more frequent.  Manage discomfort from Braxton Hicks contractions  by changing position, resting in a warm bath, drinking plenty of water, or practicing deep breathing. This information is not intended to replace advice given to you by your health care provider. Make sure you discuss any questions you have with your health care provider. Document Released: 08/06/2016 Document Revised: 03/05/2017 Document Reviewed: 08/06/2016 Elsevier Patient Education  2020 Elsevier Inc.  

## 2018-12-30 NOTE — Progress Notes (Signed)
S/w pt for televisit with Arabic interpreter. Pt reports fetal movement, denies pain. Pt is currently not at home with BP cuff.

## 2019-01-27 ENCOUNTER — Ambulatory Visit (INDEPENDENT_AMBULATORY_CARE_PROVIDER_SITE_OTHER): Payer: Medicaid Other | Admitting: Certified Nurse Midwife

## 2019-01-27 ENCOUNTER — Encounter: Payer: Self-pay | Admitting: Certified Nurse Midwife

## 2019-01-27 DIAGNOSIS — Z789 Other specified health status: Secondary | ICD-10-CM | POA: Diagnosis not present

## 2019-01-27 DIAGNOSIS — Z3A35 35 weeks gestation of pregnancy: Secondary | ICD-10-CM | POA: Diagnosis not present

## 2019-01-27 DIAGNOSIS — O99810 Abnormal glucose complicating pregnancy: Secondary | ICD-10-CM

## 2019-01-27 DIAGNOSIS — Z3400 Encounter for supervision of normal first pregnancy, unspecified trimester: Secondary | ICD-10-CM

## 2019-01-27 NOTE — Progress Notes (Signed)
ROB Telehealth visit   CC: pt has concerns regarding her B/P being low all the time.  Pt states B/P yesterday was 78/54.  Pt denies any dizzy spells, notes fatigue.

## 2019-01-27 NOTE — Progress Notes (Signed)
   TELEHEALTH VIRTUAL OBSTETRICS VISIT ENCOUNTER NOTE  I connected with Marissa Reed on 01/27/19 at  9:09 AM EDT by telephone at home and verified that I am speaking with the correct person using two identifiers.   I discussed the limitations, risks, security and privacy concerns of performing an evaluation and management service by telephone and the availability of in person appointments. I also discussed with the patient that there may be a patient responsible charge related to this service. The patient expressed understanding and agreed to proceed.  Subjective:  Marissa Reed is a 23 y.o. G1P0 at [redacted]w[redacted]d being followed for ongoing prenatal care.  She is currently monitored for the following issues for this low-risk pregnancy and has Supervision of normal pregnancy; Language barrier affecting health care; and Abnormal glucose in pregnancy, antepartum on their problem list.  Patient reports pelvic pressure. Reports fetal movement. Denies any contractions, bleeding or leaking of fluid.   The following portions of the patient's history were reviewed and updated as appropriate: allergies, current medications, past family history, past medical history, past social history, past surgical history and problem list.   Objective:   General:  Alert, oriented and cooperative.   Mental Status: Normal mood and affect perceived. Normal judgment and thought content.  Rest of physical exam deferred due to type of encounter  Assessment and Plan:  Pregnancy: G1P0 at [redacted]w[redacted]d 1. Supervision of normal first pregnancy, antepartum - patient doing well, reports occasional pelvic pressure especially with walking  - Educated and discussed that most likely fetus is in cephalic presentation and would be reason for pelvic pressure, she denies contractions or cramping- discussed we can check cervix at next appointment  - Routine prenatal care - Anticipatory guidance on upcoming appointments including GBS and GC/C at next  appointment   2. Language barrier affecting health care - [employer] interpreter used throughout Marissa Reed  3. Abnormal glucose in pregnancy, antepartum - Abnormal 1hr GTT, normal 3hr GTT  - encouraged to continue to monitor diet and weight   Preterm labor symptoms and general obstetric precautions including but not limited to vaginal bleeding, contractions, leaking of fluid and fetal movement were reviewed in detail with the patient.  I discussed the assessment and treatment plan with the patient. The patient was provided an opportunity to ask questions and all were answered. The patient agreed with the plan and demonstrated an understanding of the instructions. The patient was advised to call back or seek an in-person office evaluation/go to MAU at [facility] for any urgent or concerning symptoms. Please refer to After Visit Summary for other counseling recommendations.   I provided 12 minutes of non-face-to-face time during this encounter.  Return in about 1 week (around 02/03/2019) for ROB/GBS- in person.  Future Appointments  Date Time Provider [place]  01/30/2019 10:45 AM WH-MFC [place] 2 WH-MFCUS MFC-US  02/03/2019  8:35 AM Luvenia Redden, PA-C [facility] None    Lajean Manes, [place] for [employer], [place]

## 2019-01-30 ENCOUNTER — Other Ambulatory Visit: Payer: Self-pay

## 2019-01-30 ENCOUNTER — Ambulatory Visit (HOSPITAL_COMMUNITY)
Admission: RE | Admit: 2019-01-30 | Discharge: 2019-01-30 | Disposition: A | Discharge status: Home / Self Care | Payer: Medicaid Other | Source: Ambulatory Visit | Attending: Obstetrics and Gynecology | Admitting: Obstetrics and Gynecology

## 2019-01-30 DIAGNOSIS — Z3A35 35 weeks gestation of pregnancy: Secondary | ICD-10-CM | POA: Diagnosis not present

## 2019-01-30 DIAGNOSIS — Z3490 Encounter for supervision of normal pregnancy, unspecified, unspecified trimester: Secondary | ICD-10-CM | POA: Diagnosis present

## 2019-01-30 DIAGNOSIS — Z362 Encounter for other antenatal screening follow-up: Secondary | ICD-10-CM | POA: Diagnosis not present

## 2019-01-30 DIAGNOSIS — O359XX0 Maternal care for (suspected) fetal abnormality and damage, unspecified, not applicable or unspecified: Secondary | ICD-10-CM

## 2019-02-03 ENCOUNTER — Other Ambulatory Visit: Payer: Self-pay

## 2019-02-03 ENCOUNTER — Other Ambulatory Visit (HOSPITAL_COMMUNITY)
Admission: RE | Admit: 2019-02-03 | Discharge: 2019-02-03 | Disposition: A | Discharge status: Home / Self Care | Payer: Medicaid Other | Source: Ambulatory Visit | Attending: Medical | Admitting: Medical

## 2019-02-03 ENCOUNTER — Ambulatory Visit (INDEPENDENT_AMBULATORY_CARE_PROVIDER_SITE_OTHER): Payer: Medicaid Other | Admitting: Family Medicine

## 2019-02-03 VITALS — BP 102/69 | HR 102 | Wt 234.8 lb

## 2019-02-03 DIAGNOSIS — O99810 Abnormal glucose complicating pregnancy: Secondary | ICD-10-CM

## 2019-02-03 DIAGNOSIS — Z789 Other specified health status: Secondary | ICD-10-CM

## 2019-02-03 DIAGNOSIS — Z3403 Encounter for supervision of normal first pregnancy, third trimester: Secondary | ICD-10-CM | POA: Diagnosis present

## 2019-02-03 DIAGNOSIS — Z3009 Encounter for other general counseling and advice on contraception: Secondary | ICD-10-CM | POA: Insufficient documentation

## 2019-02-03 DIAGNOSIS — Z3A36 36 weeks gestation of pregnancy: Secondary | ICD-10-CM

## 2019-02-03 NOTE — Patient Instructions (Signed)
Breastfeeding and Self-Care It is normal to have some problems when you start to breastfeed your new baby. But there are things that you can do to take care of yourself and help prevent many common problems. This includes keeping your breasts healthy and making sure that your baby's mouth attaches (latches) properly to your nipple for feedings. Work with your doctor or breastfeeding specialist (lactation consultant) to find what works best for you. Follow these instructions at home: Breastfeeding strategy   Always make sure that your baby latches properly to breastfeed.  Make sure that your baby is in a proper position. Try different breastfeeding positions to find one that works best for you and your baby.  Breastfeed when you feel like you need to make your breasts less full or when your baby shows signs of hunger. This is called "breastfeeding on demand."  Do not delay feedings.  Try to relax when it is time to feed your baby. This helps your body release milk from your breast.  To help increase milk flow: ? Remove a small amount of milk from your breast right before breastfeeding. Do this using a pump or by squeezing with your hand. ? Apply warm, moist heat to your breast right before feeding. You can do this in the shower or with hand towels soaked with warm water. ? Massage your breast right before or during feeding. Breast care   To help your breasts stay healthy and keep them from getting too dry: ? Avoid using soap on your nipples. ? Let your nipples air-dry for 3-4 minutes after each feeding. ? Use only cotton bra pads to soak up breast milk that leaks. Be sure to change the pads if they become soaked with milk. If you use bra pads that can be thrown away, change them often. ? Put some lanolin on your nipples after breastfeeding. Pure lanolin does not need to be washed off your nipple before you feed your baby again. Pure lanolin is not harmful to your baby. ? Rub some breast  milk into your nipples: ? Use your hand to squeeze out a few drops of breast milk. ? Gently massage the milk into your nipples. ? Let your nipples air-dry.  Wear a supportive nursing bra. Avoid wearing: ? Tight clothing. ? Underwire bras or bras that put pressure on your breasts.  Use ice to help relieve pain or swelling of your breasts: ? Put ice in a plastic bag. ? Place a towel between your skin and the bag. ? Leave the ice on for 20 minutes, 2-3 times a day. General instructions  Drink enough fluid to keep your pee (urine) pale yellow.  Get plenty of rest. Sleep when your baby sleeps.  Talk to your doctor or breastfeeding specialist before taking any herbal supplements. Contact a health care provider if:  You have nipple pain.  You have cracking or soreness in your nipples that lasts longer than 1 week.  Your breasts are overfilled with milk (engorgement) and this lasts longer than 48 hours.  You have a fever.  You have pus-like fluid coming from your nipple.  You have redness, a rash, swelling, itching, or burning on your breast.  Your baby does not gain weight.  Your baby loses weight. Summary  There are things that you can do to take care of yourself and help prevent many common breastfeeding problems.  Always make sure that your baby's mouth attaches (latches) to your nipple properly to breastfeed.  Keep your nipples   from getting too dry, drink plenty of fluid, and get plenty of rest.  Feed on demand. Do not delay feedings. This information is not intended to replace advice given to you by your health care provider. Make sure you discuss any questions you have with your health care provider. Document Released: 10/28/2016 Document Revised: 07/13/2018 Document Reviewed: 10/28/2016 Elsevier Patient Education  2020 Elsevier Inc.  

## 2019-02-03 NOTE — Progress Notes (Signed)
Subjective:  Marissa Reed is a 23 y.o. G1P0 at [redacted]w[redacted]d being seen today for ongoing prenatal care.  She is currently monitored for the following issues for this low-risk pregnancy and has Supervision of normal pregnancy; Language barrier affecting health care; Abnormal glucose in pregnancy, antepartum; and Birth control counseling on their problem list.  Patient reports no complaints. Movement: Present. Denies leaking of fluid.   The following portions of the patient's history were reviewed and updated as appropriate: allergies, current medications, past family history, past medical history, past social history, past surgical history and problem list. Problem list updated.  Objective:   Vitals:   02/03/19 0909  BP: 102/69  Pulse: (!) 102  Weight: 234 lb 12.8 oz (106.5 kg)    Fetal Status: Fetal Heart Rate (bpm): 137 Fundal Height: 35 cm Movement: Present     General:  Alert, oriented and cooperative. Patient is in no acute distress.  Skin: Skin is warm and dry. No rash noted.   Cardiovascular: Normal heart rate noted  Respiratory: Normal respiratory effort, no problems with respiration noted  Abdomen: Soft, gravid, appropriate for gestational age. Pain/Pressure: Present     Pelvic:  Cervical exam deferred   Extremities: Normal range of motion.  Edema: None  Mental Status: Normal mood and affect. Normal behavior. Normal judgment and thought content.    Assessment and Plan:  Pregnancy: G1P0 at [redacted]w[redacted]d 1. Encounter for supervision of normal first pregnancy in third trimester - Continue routine prenatal care - Culture, beta strep (group b only) - GC/Chlamydia probe amp ([place])not at [facility]  2. Abnormal glucose in pregnancy, antepartum - Normal 3 hour GTT (91/160/114/133)  3. Language barrier affecting health care - in-person Arabic translator used for this encounter  4. Birth control counseling - Patient has been counseled on birth control options today. We discussed risks and  benefits of each available contraception, including but not limited to: IUD, Nexplanon, Depo Shot, Nuvaring, OCPs, and condoms/diaphragms. Counseled on risk for STDs not reduced by birth control use.  She was given brochures for each of the methods discussed and will let us know which option she would like to choose.  There are no diagnoses linked to this encounter. Preterm labor symptoms and general obstetric precautions including but not limited to vaginal bleeding, contractions, leaking of fluid and fetal movement were reviewed in detail with the patient. Please refer to After Visit Summary for other counseling recommendations.  Return in about 1 week (around 02/10/2019) for ROB.   Tequia Wolman L, DO

## 2019-02-06 LAB — GC/CHLAMYDIA PROBE AMP (~~LOC~~) NOT AT ARMC
Chlamydia: NEGATIVE
Comment: NEGATIVE
Comment: NORMAL
Neisseria Gonorrhea: NEGATIVE

## 2019-02-07 LAB — CULTURE, BETA STREP (GROUP B ONLY): Strep Gp B Culture: NEGATIVE

## 2019-02-07 LAB — OB RESULTS CONSOLE GBS: GBS: NEGATIVE

## 2019-02-10 ENCOUNTER — Encounter: Payer: Medicaid Other | Admitting: Women's Health

## 2019-02-10 ENCOUNTER — Ambulatory Visit (INDEPENDENT_AMBULATORY_CARE_PROVIDER_SITE_OTHER): Payer: Medicaid Other | Admitting: Medical

## 2019-02-10 ENCOUNTER — Encounter: Payer: Medicaid Other | Admitting: Medical

## 2019-02-10 ENCOUNTER — Other Ambulatory Visit: Payer: Self-pay

## 2019-02-10 VITALS — BP 115/76 | HR 105 | Wt 240.0 lb

## 2019-02-10 DIAGNOSIS — Z3403 Encounter for supervision of normal first pregnancy, third trimester: Secondary | ICD-10-CM

## 2019-02-10 DIAGNOSIS — O99810 Abnormal glucose complicating pregnancy: Secondary | ICD-10-CM

## 2019-02-10 DIAGNOSIS — Z3A37 37 weeks gestation of pregnancy: Secondary | ICD-10-CM

## 2019-02-10 DIAGNOSIS — Z789 Other specified health status: Secondary | ICD-10-CM

## 2019-02-10 NOTE — Progress Notes (Signed)
Pt is here for ROB. [redacted]w[redacted]d.  

## 2019-02-10 NOTE — Progress Notes (Signed)
   PRENATAL VISIT NOTE  Subjective:  Marissa Reed is a 23 y.o. G1P0 at [redacted]w[redacted]d being seen today for ongoing prenatal care.  She is currently monitored for the following issues for this low-risk pregnancy and has Supervision of normal pregnancy; Language barrier affecting health care; Abnormal glucose in pregnancy, antepartum; and Birth control counseling on their problem list.  Patient reports no complaints.  Contractions: Not present. Vag. Bleeding: None.  Movement: Present. Denies leaking of fluid.   The following portions of the patient's history were reviewed and updated as appropriate: allergies, current medications, past family history, past medical history, past social history, past surgical history and problem list.   Objective:   Vitals:   02/10/19 1023  BP: 115/76  Pulse: (!) 105  Weight: 240 lb (108.9 kg)    Fetal Status: Fetal Heart Rate (bpm): 162 Fundal Height: 38 cm Movement: Present     General:  Alert, oriented and cooperative. Patient is in no acute distress.  Skin: Skin is warm and dry. No rash noted.   Cardiovascular: Normal heart rate noted  Respiratory: Normal respiratory effort, no problems with respiration noted  Abdomen: Soft, gravid, appropriate for gestational age.  Pain/Pressure: Present     Pelvic: Cervical exam deferred        Extremities: Normal range of motion.  Edema: None  Mental Status: Normal mood and affect. Normal behavior. Normal judgment and thought content.   Assessment and Plan:  Pregnancy: G1P0 at [redacted]w[redacted]d 1. Encounter for supervision of normal first pregnancy in third trimester - Doing well, no complaints  - Normal [place] for growth 10/26 discussed  - GBS negative - patient informed  - Still undecided on birth control   2. Language barrier affecting health care - Interpreter used   3. Abnormal glucose in pregnancy, antepartum - failed 1 hour, passed 3 hour   Term labor symptoms and general obstetric precautions including but not limited  to vaginal bleeding, contractions, leaking of fluid and fetal movement were reviewed in detail with the patient. Please refer to After Visit Summary for other counseling recommendations.   Return in about 1 week (around 02/17/2019).  Future Appointments  Date Time Provider [place]  02/10/2019 11:15 AM Luvenia Redden, PA-C [facility] None  02/17/2019 10:00 AM Sparacino, Hailey L, DO [facility] None  02/24/2019  9:30 AM Nugent, Gerrie Nordmann, NP [facility] None    Kerry Hough, PA-C

## 2019-02-10 NOTE — Patient Instructions (Addendum)
Fetal Movement Counts °Patient Name: ________________________________________________ Patient Due Date: ____________________ °What is a fetal movement count? ° °A fetal movement count is the number of times that you feel your baby move during a certain amount of time. This may also be called a fetal kick count. A fetal movement count is recommended for every pregnant woman. You may be asked to start counting fetal movements as early as week 28 of your pregnancy. °Pay attention to when your baby is most active. You may notice your baby's sleep and wake cycles. You may also notice things that make your baby move more. You should do a fetal movement count: °· When your baby is normally most active. °· At the same time each day. °A good time to count movements is while you are resting, after having something to eat and drink. °How do I count fetal movements? °1. Find a quiet, comfortable area. Sit, or lie down on your side. °2. Write down the date, the start time and stop time, and the number of movements that you felt between those two times. Take this information with you to your health care visits. °3. For 2 hours, count kicks, flutters, swishes, rolls, and jabs. You should feel at least 10 movements during 2 hours. °4. You may stop counting after you have felt 10 movements. °5. If you do not feel 10 movements in 2 hours, have something to eat and drink. Then, keep resting and counting for 1 hour. If you feel at least 4 movements during that hour, you may stop counting. °Contact a health care provider if: °· You feel fewer than 4 movements in 2 hours. °· Your baby is not moving like he or she usually does. °Date: ____________ Start time: ____________ Stop time: ____________ Movements: ____________ °Date: ____________ Start time: ____________ Stop time: ____________ Movements: ____________ °Date: ____________ Start time: ____________ Stop time: ____________ Movements: ____________ °Date: ____________ Start time:  ____________ Stop time: ____________ Movements: ____________ °Date: ____________ Start time: ____________ Stop time: ____________ Movements: ____________ °Date: ____________ Start time: ____________ Stop time: ____________ Movements: ____________ °Date: ____________ Start time: ____________ Stop time: ____________ Movements: ____________ °Date: ____________ Start time: ____________ Stop time: ____________ Movements: ____________ °Date: ____________ Start time: ____________ Stop time: ____________ Movements: ____________ °This information is not intended to replace advice given to you by your health care provider. Make sure you discuss any questions you have with your health care provider. °Document Released: 04/22/2006 Document Revised: 04/12/2018 Document Reviewed: 05/02/2015 °Elsevier Patient Education © 2020 Elsevier Inc. °Braxton Hicks Contractions °Contractions of the uterus can occur throughout pregnancy, but they are not always a sign that you are in labor. You may have practice contractions called Braxton Hicks contractions. These false labor contractions are sometimes confused with true labor. °What are Braxton Hicks contractions? °Braxton Hicks contractions are tightening movements that occur in the muscles of the uterus before labor. Unlike true labor contractions, these contractions do not result in opening (dilation) and thinning of the cervix. Toward the end of pregnancy (32-34 weeks), Braxton Hicks contractions can happen more often and may become stronger. These contractions are sometimes difficult to tell apart from true labor because they can be very uncomfortable. You should not feel embarrassed if you go to the hospital with false labor. °Sometimes, the only way to tell if you are in true labor is for your health care provider to look for changes in the cervix. The health care provider will do a physical exam and may monitor your contractions. If you   are not in true labor, the exam should show  that your cervix is not dilating and your water has not broken. °If there are no other health problems associated with your pregnancy, it is completely safe for you to be sent home with false labor. You may continue to have Braxton Hicks contractions until you go into true labor. °How to tell the difference between true labor and false labor °True labor °· Contractions last 30-70 seconds. °· Contractions become very regular. °· Discomfort is usually felt in the top of the uterus, and it spreads to the lower abdomen and low back. °· Contractions do not go away with walking. °· Contractions usually become more intense and increase in frequency. °· The cervix dilates and gets thinner. °False labor °· Contractions are usually shorter and not as strong as true labor contractions. °· Contractions are usually irregular. °· Contractions are often felt in the front of the lower abdomen and in the groin. °· Contractions may go away when you walk around or change positions while lying down. °· Contractions get weaker and are shorter-lasting as time goes on. °· The cervix usually does not dilate or become thin. °Follow these instructions at home: ° °· Take over-the-counter and prescription medicines only as told by your health care provider. °· Keep up with your usual exercises and follow other instructions from your health care provider. °· Eat and drink lightly if you think you are going into labor. °· If Braxton Hicks contractions are making you uncomfortable: °? Change your position from lying down or resting to walking, or change from walking to resting. °? Sit and rest in a tub of warm water. °? Drink enough fluid to keep your urine pale yellow. Dehydration may cause these contractions. °? Do slow and deep breathing several times an hour. °· Keep all follow-up prenatal visits as told by your health care provider. This is important. °Contact a health care provider if: °· You have a fever. °· You have continuous pain in  your abdomen. °Get help right away if: °· Your contractions become stronger, more regular, and closer together. °· You have fluid leaking or gushing from your vagina. °· You pass blood-tinged mucus (bloody show). °· You have bleeding from your vagina. °· You have low back pain that you never had before. °· You feel your baby’s head pushing down and causing pelvic pressure. °· Your baby is not moving inside you as much as it used to. °Summary °· Contractions that occur before labor are called Braxton Hicks contractions, false labor, or practice contractions. °· Braxton Hicks contractions are usually shorter, weaker, farther apart, and less regular than true labor contractions. True labor contractions usually become progressively stronger and regular, and they become more frequent. °· Manage discomfort from Braxton Hicks contractions by changing position, resting in a warm bath, drinking plenty of water, or practicing deep breathing. °This information is not intended to replace advice given to you by your health care provider. Make sure you discuss any questions you have with your health care provider. °Document Released: 08/06/2016 Document Revised: 03/05/2017 Document Reviewed: 08/06/2016 °Elsevier Patient Education © 2020 Elsevier Inc. ° °AREA PEDIATRIC/FAMILY PRACTICE PHYSICIANS ° °[place] ([address]) °• [facility] °o Chambliss, MD; Eniola, MD; Hale, MD; Hensel, MD; McDiarmid, MD; McIntyer, MD; Neal, MD; Walden, MD °o [address] [place], [place] [address] °o [phone] °o Mon-Fri 8:30-12:30, 1:30-5:00 °o Providers come to see babies at [facility] °o Accepting Medicaid °• [facility] °o Limited providers who accept newborns: Koirala, MD; Morrow, MD; Wolters, MD °o [address], [place], [place] [address] °o [phone] °o Mon-Fri 8:00-5:30 °o Babies seen by providers at [facility] °o Does NOT accept  Medicaid °o Please call early in hospitalization for appointment (limited availability)  °• [facility] °o Mulberry, MD °o [address] [place], [place] [address] °o [phone] °o Mon, Tue, Thur, Fri 8:30-5:00, Wed 10:00-7:00 (closed 1-2pm) °o Babies seen by [facility] providers °o Accepting Medicaid °• [facility] °o Rubin, MD °o [address], [place], [place] [address] °o [phone] °o Mon-Fri 8:30-5:00, Sat 8:30-12:00 °o Provider comes to see babies at [facility] °o Accepting Medicaid °o Must have been referred from current patients or contacted office prior to delivery °• [facility] ([facility]) °o Brown, MD; Chandler, MD; Ettefagh, MD; Grant, MD; Lester, MD; McCormick, MD; McQueen, MD; Prose, MD; Simha, MD; Stanley, MD; Stryffeler, NP; Tebben, NP °o [address], [place], [place] [address] °o [phone] °o Mon, Tue, Thur, Fri 8:30-5:30, Wed 9:30-5:30, Sat 8:30-12:30 °o Babies seen by [facility] providers °o Accepting Medicaid °o Only accepting infants of first-time parents or siblings of current patients °o Hospital discharge coordinator will make follow-up appointment °• Jack Amos °o [address], [place], [place]  [address] °o [phone]   Fax - [phone] °• [facility] °o [address], [place], [place]  [address] °o Phone - [phone]   Fax - [phone] °• Shilpa Gosrani °o [address], [place], [place]  [address] °o [phone] ° °[place] ([address]) °•  [facility] °o Bates, MD; [place], MD; Cooper, Cox, MD; MD; Davis, MD; Dovico, MD; Ettefaugh, MD; Little, MD; Lowe, MD; Keiffer, MD; Melvin, MD; Sumner, MD; Williams, MD °o [address], [place], [place] [address] °o [phone] °o Mon-Fri 8:30-5:00 (extended evenings Mon-Thur as needed), Sat-Sun 10:00-1:00 °o Providers come to see babies at [facility] °o Accepting Medicaid for families of first-time babies and families with all children in the household age 3 and under. Must register with office prior to making appointment (M-F only). °• [facility] °o Henson, NP; Knapp, MD; Lalonde, MD; Tysinger, PA °o [address] [place], [place] [address] °o [phone] °o Mon-Fri 8:00-5:00 °o Babies seen by providers at [facility] °o Does NOT accept Medicaid/Commercial Insurance Only °• [facility] ([facility]  °o Artis, MD; Barnes, MD; Bratton, MD; Coccaro, MD; Lockett Gardner, MD; Kramer, MD; Marshall, MD; Netherton, MD; Poleto, MD; Skinner, MD °o [address] [place], [place] [address] °o [phone] °o Mon-Fri 8:30-5:30, Sat (Oct.-Mar.) 9:00-1:00 °o Babies seen by providers at [facility] °o Accepting Medicaid ° °[place] ([address]) °• [facility] °o Reid, MD; Warner, MD °o [address], [place], [place] [address] °o [phone] °o Mon-Fri 8:30-5:00, Sat 8:30-12:00 °o Providers come to see babies at [facility] °o Does NOT accept Medicaid °• [facility] °o Becker, PA; Hagler, MD; Scifres, PA; Sun, MD; Swayne, MD °o [address], [place],  [address] °o [phone] °o Mon-Fri 8:00-5:00 °o Babies seen by providers at [facility] °o Does NOT accept Medicaid °o Only accepting babies of parents who are patients °o Please call early in hospitalization for appointment (limited availability) °• [facility] °o Clark, MD; Frye, MD; Kelleher, MD; Mack, NP; Miller, MD; O'Keller, MD; [place], NP; Pudlo, MD; Puzio, MD; Thomas, MD; Tucker, MD; Twiselton, MD °o [address], [place], [place] [address] °o [phone] °o Mon-Fri 8:00-5:00, Sat 9:00-12:00 °o Providers come to see babies at [facility] °o Does NOT accept Medicaid ° °[place] ([address]) °• [facility] °o Limited providers accepting new patients: Brake, NP; Wharton, PA °o [address], [place], [place] [address] °o [phone] °o Mon-Fri 8:00-5:00 °o Babies seen by providers at [facility] °o Does NOT accept Medicaid °o Only accepting babies of parents who are patients °o Please call early in hospitalization for appointment (limited availability) °• [facility] °o Gay, MD; Quinlan, MD °o [address] [place], [place] [address] °o [phone] (press 1 to schedule appointment) °o Mon-Fri 8:00-5:00 °o Providers come to see babies at [facility] °o Does NOT accept Medicaid °• [facility] °o Mazer, MD °o [address] [place], [place] [address] °o [phone] °o Mon-Fri 8:30-5:00 (lunch 12:30-1:00), extended hours by appointment only Wed 5:00-6:30 °o Babies seen by [facility] providers °o Accepting Medicaid °• [facility] °o Banks, MD; Jordan, MD; Koberlein, MD °o [address], [place], [place] [address] °o [phone] °o Mon-Fri 8:00-5:00 °o Babies seen by [facility] providers °o Does NOT accept Medicaid °• [facility] °o [place], MD; Hunter, MD; Wallace, DO °o [address] [place], [place] [address] °o [phone] °o Mon-Fri 8:00-5:00 °o Babies seen by [facility] providers °o Does NOT accept Medicaid °• [facility] °o Brandon, PA; Brecken, PA; Christy, NP; Dees, MD; DeClaire, MD; DeWeese, MD; Hansen, NP; Mills, NP; Parrish, NP; Smoot, NP; Summer, MD; Vapne, MD °o [address] [place], [place] [address] °o [phone] °o Mon-Fri 8:30-5:00, Sat 10:00-1:00 °o Providers come to see babies at [facility] °o Does NOT accept Medicaid °o Free prenatal information session Tuesdays at 4:45pm °• [facility] °o Bouska, MD; Gordon, PA; Jeffery, PA; Weber, PA °o [address] [place] [address] °o [phone] °o Mon-Fri  7:30-5:30 °o Babies seen by [facility] providers °• [facility] °o [address], [place], [place]  [address] °o [phone]   Fax - [phone] ° °[place] ([address]) °• [facility] °o Reese, MD °o [address] [place], [place] [address] °o [phone] °o Mon-Thur 8:00-6:00 °o Providers come to see babies at [facility] °o Accepting Medicaid °• [facility] °o Anderson, NP; Badger, MD; Beal, PA; Spencer, PA °o [address] [place], [place] [address] °o [phone] °o Mon-Thur 7:30-7:30, Fri 7:30-4:30 °o Babies seen by [facility] providers °o Accepting Medicaid °• [facility] °o Agbuya, MD; Klett, NP; Romgoolam, MD °o [address], [place], [place] [address] °o [phone] °o Mon-Fri 8:30-5:00, Sat 8:30-12:00 °o Providers come to see babies at [facility] °o Accepting Medicaid °o Must have “Meet & Greet” appointment at office prior to delivery °• [facility] ([facility] °o McCord, MD; Wallace, MD; Wood, MD °o [address], [place],  [address] °o [phone] °o Mon-Wed 8:00-6:00, Thur-Fri 8:00-5:00, Sat 9:00-12:00 °o Providers come to see babies at [facility] °o Does NOT accept Medicaid °o Only accepting siblings of current patients °• [facility]  °o [address], ,   [place]  [address] °o [phone]   Fax - [phone] °• [facility] °o [address], [place], [place]  [address] °o [phone]   Fax - [phone] ° °[place] ([address]) °• [facility] [facility] °o Cirigliano, DO; Matthews, DO °o [address] [place], [place] [address] °o [phone] °o Mon-Fri 7:00-5:00 °o Babies seen by [facility] providers °o Does NOT accept Medicaid °• [facility] °o Briscoe, MD; Howley, PA; Moreira, PA °o [address], [place], [place] [address] °o [phone] °o Mon-Fri 8:00-5:00 °o Babies seen by [facility] providers °o Accepting Medicaid °• [facility] °o Boyd, MD; Church, PA; Jones, NP; Osborn, PA °o [address], [place], [place] [address] °o [phone] °o Mon-Fri 8:00-5:00 °o Babies seen by providers at [facility] °o Accepting Medicaid ° °[place] ([address]) °• [facility] [place] °o Wendling, DO °o [address] [place], [place] [address] °o [phone] °o Mon-Fri 8:00-5:00 °o Babies seen by [facility] providers °o Does NOT accept Medicaid °o Limited availability, please call early in hospitalization to schedule follow-up °• [facility] °o Calderon, PA; Cummings, MD; Dillard, MD; Martin, PA; Olson, MD; VanDeven, PA °o [address], [place], [place] [address] °o [phone] °o Mon-Fri 8:30-5:00, Sat 9:00-12:00 °o Babies seen by providers at [facility] °o Accepting Medicaid °o Please register online then schedule online or call office °o [email or web address] °• [facility] °o Hunter, NP; Kumar, MD; Martin Rogers, PA °o [address], [place], [place] [address] °o [phone] °o Mon-Fri 8:00-5:00 °o Babies seen by providers at [facility] °o Accepting Medicaid °• [facility] ([facility] °o [place], MD; Kristi Fleenor, NP; West, MD °o [address], [place], [place] [address] °o [phone] °o Mon-Fri 8:00-5:30, Sat&Sun by appointment (phones open at 8:30) °o Babies seen by [facility] providers °o Accepting Medicaid °o Must be a first-time baby or sibling of current patient °• [facility] [place]  °o [address], [place], [place]   [address] °o [phone]   Fax - [phone] ° °[place] ([address]) °• [facility] [place] [facility] °o Brown, PA; Cowen, PA; Rice, MD; Helton, PA; Spry, MD °o [address] [place], [place] [address] °o [phone] °o Mon-Thur 8:00-7:00, Fri 8:00-5:00, Sat 8:00-12:00, Sun 9:00-12:00 °o Babies seen by [facility] providers °o Accepting Medicaid °• [facility] [place] °o Coe-Goins, MD; Marshall, MD; Pierre-Louis, MD °o [address] [place] [address], [place], [place] [address] °o [phone] °o Mon-Thur 8:00-5:00 °o Babies seen by providers at [facility] °o Accepting Medicaid °• [facility] °o Bratton, MD; Coe-Goins, MD; Hayes, MD; Lewis, MD; List, MD; Lott, MD; Marshall, MD; Moran, MD; O'Neal, MD; Pierre-Louis, MD; Pitonzo, MD; Scholer, MD; Spangle, MD °o [address] [place],  [address] °o [phone] °o Mon-Fri 8:00-5:30, Sat (Oct.-Mar.) 9:00-1:00 °o Babies seen by providers at [facility] °o Accepting Medicaid °o Must fill out new patient packet, available online at [email or web address] °• [facility] ([facility] °o Friddle, NP; Harris, NP; Kelly, NP; Logan, MD; Melvin, PA; Poth, MD; Ramadoss, MD; Stanton,   NP °o [address], [place], [place] [address] °o [phone] °o Mon-Thur 8:00-5:30, Fri 8:00-5:00 °o Babies seen by providers at [facility] °o Accepting Medicaid ° °[place] ([address]) °• [facility] °o Dixon, PA; [place], MD; Pickard, MD; Tapia, PA °o [address], [place], [place] [address] °o [phone] °o Mon-Fri 8:00-5:00 °o Babies seen by providers at [facility] °o Accepting Medicaid  ° °[place] ([address]) °• [facility] [place] °o Masneri, DO; Meyers, MD; Nelson, PA °o [address], [place], [place] [address] °o [phone] °o Mon-Fri 8:00-5:00 °o Babies  seen by providers at [facility] °o Does NOT accept Medicaid °o Limited appointment availability, please call early in hospitalization ° °• [facility] [place] °o Kunedd, DO; McGowen, MD °o [address], [place], [place] [address] °o [phone] °o Mon-Fri 8:00-5:00 °o Babies seen by [facility] providers °o Does NOT accept Medicaid °• [facility] [place] °o Cameron, MD; MacDonald, MD; Michaels, PA; Nayak, MD °o [address] [place] [address], [place], [place] [address] °o [phone] °o Mon-Fri 8:00-5:00 °o After hours clinic ([address] [place], [place] [address]) [phone] Mon-Fri 5:00-8:00, Sat 12:00-6:00, Sun 10:00-4:00 °o Babies seen by [facility] providers °o Accepting Medicaid °• [facility] [place] °o [address], [place], [place]  [address] °o [phone]   Fax - [phone] ° °[place] ([address]) °• [facility] [place] °o Andy, MD °o [address], [place], [place] [address] °o [phone] °o Mon-Fri 8:00-5:00 °o Babies seen by [facility] providers °o Does NOT accept Medicaid °• [facility] [place] ([facility] [place]) °o Eksir, MD °o [address], [place], [place] [address] °o [phone] °o Mon-Thur 8:00-7:00, Fri 8:00-5:00, Sat 8:00-12:00 °o Babies seen by providers at [facility] °o Accepting Medicaid - but does not have vaccinations in office (must be received elsewhere) °o Limited availability, please call early in hospitalization ° °[place] ([address]) °• [facility]  °o Charlene Flemming, MD °o [address],   [address] °o [phone]  Fax [phone] ° ° °

## 2019-02-17 ENCOUNTER — Other Ambulatory Visit: Payer: Self-pay

## 2019-02-17 ENCOUNTER — Ambulatory Visit (INDEPENDENT_AMBULATORY_CARE_PROVIDER_SITE_OTHER): Payer: Medicaid Other | Admitting: Family Medicine

## 2019-02-17 VITALS — BP 108/75 | HR 112 | Wt 243.0 lb

## 2019-02-17 DIAGNOSIS — Z3403 Encounter for supervision of normal first pregnancy, third trimester: Secondary | ICD-10-CM

## 2019-02-17 DIAGNOSIS — O99810 Abnormal glucose complicating pregnancy: Secondary | ICD-10-CM

## 2019-02-17 DIAGNOSIS — Z789 Other specified health status: Secondary | ICD-10-CM

## 2019-02-17 DIAGNOSIS — Z3A38 38 weeks gestation of pregnancy: Secondary | ICD-10-CM

## 2019-02-17 NOTE — Progress Notes (Addendum)
Subjective:  Marissa Reed is a 23 y.o. G1P0 at [redacted]w[redacted]d being seen today for ongoing prenatal care.  She is currently monitored for the following issues for this low-risk pregnancy and has Supervision of normal pregnancy; Language barrier affecting health care; Abnormal glucose in pregnancy, antepartum; and Birth control counseling on their problem list.  Patient reports no complaints. But does reports more lower extremity swelling in her bilateral feet and ankles.  Contractions: Not present. Vag. Bleeding: None.  Movement: Present. Denies leaking of fluid.   The following portions of the patient's history were reviewed and updated as appropriate: allergies, current medications, past family history, past medical history, past social history, past surgical history and problem list. Problem list updated.  Objective:   Vitals:   02/17/19 0954  BP: 108/75  Pulse: (!) 112  Weight: 243 lb (110.2 kg)    Fetal Status: Fetal Heart Rate (bpm): 144 Fundal Height: 39 cm Movement: Present     General:  Alert, oriented and cooperative. Patient is in no acute distress.  Skin: Skin is warm and dry. No rash noted.   Cardiovascular: Normal heart rate noted  Respiratory: Normal respiratory effort, no problems with respiration noted  Abdomen: Soft, gravid, appropriate for gestational age. Pain/Pressure: Absent     Pelvic: Vag. Bleeding: None     Cervical exam deferred        Extremities: Normal range of motion.  Edema: Mild pitting, slight indentation  Mental Status: Normal mood and affect. Normal behavior. Normal judgment and thought content.    Assessment and Plan:  Pregnancy: G1P0 at [redacted]w[redacted]d  1. Encounter for supervision of normal first pregnancy in third trimester - Continue routine prenatal care - GBS neg - Normal swelling of pregnancy - Discussed natural ways to induce labor including RRL tea and intercourse - Still undecided on birth control  2. Language barrier affecting health care - Arabic  interpreter used  3. Abnormal glucose in pregnancy, antepartum - Failed 1 hour, passed 3 hour (normal)  Term labor symptoms and general obstetric precautions including but not limited to vaginal bleeding, contractions, leaking of fluid and fetal movement were reviewed in detail with the patient. Please refer to After Visit Summary for other counseling recommendations.  Return in about 1 week (around 02/24/2019) for ROB.   Gennaro Lizotte L, DO

## 2019-02-17 NOTE — Patient Instructions (Addendum)
Signs and Symptoms of Labor Labor is your body's natural process of moving your baby, placenta, and umbilical cord out of your uterus. The process of labor usually starts when your baby is full-term, between 37 and 40 weeks of pregnancy. How will I know when I am close to going into labor? As your body prepares for labor and the birth of your baby, you may notice the following symptoms in the weeks and days before true labor starts:  Having a strong desire to get your home ready to receive your new baby. This is called nesting. Nesting may be a sign that labor is approaching, and it may occur several weeks before birth. Nesting may involve cleaning and organizing your home.  Passing a small amount of thick, bloody mucus out of your vagina (normal bloody show or losing your mucus plug). This may happen more than a week before labor begins, or it might occur right before labor begins as the opening of the cervix starts to widen (dilate). For some women, the entire mucus plug passes at once. For others, smaller portions of the mucus plug may gradually pass over several days.  Your baby moving (dropping) lower in your pelvis to get into position for birth (lightening). When this happens, you may feel more pressure on your bladder and pelvic bone and less pressure on your ribs. This may make it easier to breathe. It may also cause you to need to urinate more often and have problems with bowel movements.  Having "practice contractions" (Braxton Hicks contractions) that occur at irregular (unevenly spaced) intervals that are more than 10 minutes apart. This is also called false labor. False labor contractions are common after exercise or sexual activity, and they will stop if you change position, rest, or drink fluids. These contractions are usually mild and do not get stronger over time. They may feel like: ? A backache or back pain. ? Mild cramps, similar to menstrual cramps. ? Tightening or pressure in  your abdomen. Other early symptoms that labor may be starting soon include:  Nausea or loss of appetite.  Diarrhea.  Having a sudden burst of energy, or feeling very tired.  Mood changes.  Having trouble sleeping. How will I know when labor has begun? Signs that true labor has begun may include:  Having contractions that come at regular (evenly spaced) intervals and increase in intensity. This may feel like more intense tightening or pressure in your abdomen that moves to your back. ? Contractions may also feel like rhythmic pain in your upper thighs or back that comes and goes at regular intervals. ? For first-time mothers, this change in intensity of contractions often occurs at a more gradual pace. ? Women who have given birth before may notice a more rapid progression of contraction changes.  Having a feeling of pressure in the vaginal area.  Your water breaking (rupture of membranes). This is when the sac of fluid that surrounds your baby breaks. When this happens, you will notice fluid leaking from your vagina. This may be clear or blood-tinged. Labor usually starts within 24 hours of your water breaking, but it may take longer to begin. ? Some women notice this as a gush of fluid. ? Others notice that their underwear repeatedly becomes damp. Follow these instructions at home:   When labor starts, or if your water breaks, call your health care provider or nurse care line. Based on your situation, they will determine when you should go in for an   exam.  When you are in early labor, you may be able to rest and manage symptoms at home. Some strategies to try at home include: ? Breathing and relaxation techniques. ? Taking a warm bath or shower. ? Listening to music. ? Using a heating pad on the lower back for pain. If you are directed to use heat:  Place a towel between your skin and the heat source.  Leave the heat on for 20-30 minutes.  Remove the heat if your skin turns  bright red. This is especially important if you are unable to feel pain, heat, or cold. You may have a greater risk of getting burned. Get help right away if:  You have painful, regular contractions that are 5 minutes apart or less.  Labor starts before you are [redacted] weeks along in your pregnancy.  You have a fever.  You have a headache that does not go away.  You have bright red blood coming from your vagina.  You do not feel your baby moving.  You have a sudden onset of: ? Severe headache with vision problems. ? Nausea, vomiting, or diarrhea. ? Chest pain or shortness of breath. These symptoms may be an emergency. If your health care provider recommends that you go to the hospital or birth center where you plan to deliver, do not drive yourself. Have someone else drive you, or call emergency services (911 in the [place] Summary  Labor is your body's natural process of moving your baby, placenta, and umbilical cord out of your uterus.  The process of labor usually starts when your baby is full-term, between 82 and 40 weeks of pregnancy.  When labor starts, or if your water breaks, call your health care provider or nurse care line. Based on your situation, they will determine when you should go in for an exam. This information is not intended to replace advice given to you by your health care provider. Make sure you discuss any questions you have with your health care provider. Document Released: 08/28/2016 Document Revised: 12/21/2016 Document Reviewed: 08/28/2016 Elsevier Patient Education  2020 [place] to have your son circumcised:                                                                      [facility]     [phone]   [phone] while you are in hospital         [facility]              [phone]   $[phone] by 4 wks                      [facility]                     [phone]   $269 by 7 days [facility]                    [phone]   $269 by 4 wks [facility]              [phone]   $225 by 2 wks    These prices sometimes change but are roughly what you can expect to pay. Please call and confirm  pricing.   Circumcision is considered an elective/non-medically necessary procedure. There are many reasons parents decide to have their sons circumsized. During the first year of life circumcised males have a reduced risk of urinary tract infections but after this year the rates between circumcised males and uncircumcised males are the same.  It is safe to have your son circumcised outside of the hospital and the places above perform them regularly.   Deciding about Circumcision in Baby Boys  (Up-to-date The Basics)  What is circumcision?   Circumcision is a surgery that removes the skin that covers the tip of the penis, called the "foreskin" Circumcision is usually done when a boy is between 19 and 2 days old. In the [place], circumcision is common. In some other countries, fewer boys are circumcised. Circumcision is a common tradition in some religions.  Should I have my baby boy circumcised?   There is no easy answer. Circumcision has some benefits. But it also has risks. After talking with your doctor, you will have to decide for yourself what is right for your family.  What are the benefits of circumcision?   Circumcised boys seem to have slightly lower rates of: ?Urinary tract infections ?Swelling of the opening at the tip of the penis Circumcised men seem to have slightly lower rates of: ?Urinary tract infections ?Swelling of the opening at the tip of the penis ?Penis cancer ?HIV and other infections that you catch during sex ?Cervical cancer in the women they have sex with Even so, in the [place], the risks of these problems are small - even in boys and men who have not been circumcised. Plus, boys and men who are not circumcised can reduce these extra risks by: ?Cleaning their penis well ?Using condoms during sex  What are the risks of  circumcision?  Risks include: ?Bleeding or infection from the surgery ?Damage to or amputation of the penis ?A chance that the doctor will cut off too much or not enough of the foreskin ?A chance that sex won't feel as good later in life Only about 1 out of every 200 circumcisions leads to problems. There is also a chance that your health insurance won't pay for circumcision.  How is circumcision done in baby boys?  First, the baby gets medicine for pain relief. This might be a cream on the skin or a shot into the base of the penis. Next, the doctor cleans the baby's penis well. Then he or she uses special tools to cut off the foreskin. Finally, the doctor wraps a bandage (called gauze) around the baby's penis. If you have your baby circumcised, his doctor or nurse will give you instructions on how to care for him after the surgery. It is important that you follow those instructions carefully.

## 2019-02-17 NOTE — Progress Notes (Signed)
Pt complains of swelling in her feet

## 2019-02-23 NOTE — Progress Notes (Signed)
Subjective:  Marissa Reed is a 23 y.o. G1P0 at [redacted]w[redacted]d being seen today for ongoing prenatal care.  She is currently monitored for the following issues for this low-risk pregnancy and has Supervision of normal pregnancy; Language barrier affecting health care; Abnormal glucose in pregnancy, antepartum; and Birth control counseling on their problem list.  Patient reports no complaints.  Contractions: Irregular. Vag. Bleeding: None.  Movement: Present. Denies leaking of fluid.  Pt reports DFM x2-3 days. Upon clarification, pt describes movements as softer, but not fewer.  The following portions of the patient's history were reviewed and updated as appropriate: allergies, current medications, past family history, past medical history, past social history, past surgical history and problem list. Problem list updated.  Objective:   Vitals:   02/24/19 0944  BP: 104/71  Pulse: (!) 111  Weight: 243 lb (110.2 kg)    Fetal Status: Fetal Heart Rate (bpm): NST   Movement: Present  Presentation: Vertex  General:  Alert, oriented and cooperative. Patient is in no acute distress.  Skin: Skin is warm and dry. No rash noted.   Cardiovascular: Normal heart rate noted  Respiratory: Normal respiratory effort, no problems with respiration noted  Abdomen: Soft, gravid, appropriate for gestational age. Pain/Pressure: Present     Pelvic: Vag. Bleeding: None     Cervical exam performed Dilation: 2 Effacement (%): 50 Station: 0  Extremities: Normal range of motion.     Mental Status: Normal mood and affect. Normal behavior. Normal judgment and thought content.   Assessment and Plan:  Pregnancy: G1P0 at [redacted]w[redacted]d  1. Encounter for supervision of normal first pregnancy in third trimester -IOL scheduled for 03/09/2019 -discussed contraception, pt elects condoms  2. Language barrier affecting health care - Arabic interpreter used  3. Abnormal glucose in pregnancy, antepartum - failed 1hr, passed 3hr  4.  Decreased fetal movements in third trimester, single or unspecified fetus - Fetal nonstress test -EFM: reactive       -baseline: 145       -variability: moderate       -accels: present, 15x15       -decels: absent       -TOCO: irritability  Term labor symptoms and general obstetric precautions including but not limited to vaginal bleeding, contractions, leaking of fluid and fetal movement were reviewed in detail with the patient. Please refer to After Visit Summary for other counseling recommendations.  Return in about 1 week (around 03/03/2019) for NST/OB - MAU, NST/OB 03/06/2019 in clinic.   Nugent, Gerrie Nordmann, NP

## 2019-02-24 ENCOUNTER — Other Ambulatory Visit: Payer: Self-pay

## 2019-02-24 ENCOUNTER — Telehealth (HOSPITAL_COMMUNITY): Payer: Self-pay | Admitting: *Deleted

## 2019-02-24 ENCOUNTER — Ambulatory Visit (INDEPENDENT_AMBULATORY_CARE_PROVIDER_SITE_OTHER): Payer: Medicaid Other | Admitting: Women's Health

## 2019-02-24 VITALS — BP 104/71 | HR 111 | Wt 243.0 lb

## 2019-02-24 DIAGNOSIS — O368130 Decreased fetal movements, third trimester, not applicable or unspecified: Secondary | ICD-10-CM

## 2019-02-24 DIAGNOSIS — Z3A39 39 weeks gestation of pregnancy: Secondary | ICD-10-CM

## 2019-02-24 DIAGNOSIS — O99810 Abnormal glucose complicating pregnancy: Secondary | ICD-10-CM | POA: Diagnosis not present

## 2019-02-24 DIAGNOSIS — Z789 Other specified health status: Secondary | ICD-10-CM

## 2019-02-24 DIAGNOSIS — Z3403 Encounter for supervision of normal first pregnancy, third trimester: Secondary | ICD-10-CM

## 2019-02-24 NOTE — Telephone Encounter (Signed)
Preadmission screen Interpreter number [phone]

## 2019-02-24 NOTE — Patient Instructions (Signed)
Signs and Symptoms of Labor Labor is your body's natural process of moving your baby, placenta, and umbilical cord out of your uterus. The process of labor usually starts when your baby is full-term, between 37 and 40 weeks of pregnancy. How will I know when I am close to going into labor? As your body prepares for labor and the birth of your baby, you may notice the following symptoms in the weeks and days before true labor starts:  Having a strong desire to get your home ready to receive your new baby. This is called nesting. Nesting may be a sign that labor is approaching, and it may occur several weeks before birth. Nesting may involve cleaning and organizing your home.  Passing a small amount of thick, bloody mucus out of your vagina (normal bloody show or losing your mucus plug). This may happen more than a week before labor begins, or it might occur right before labor begins as the opening of the cervix starts to widen (dilate). For some women, the entire mucus plug passes at once. For others, smaller portions of the mucus plug may gradually pass over several days.  Your baby moving (dropping) lower in your pelvis to get into position for birth (lightening). When this happens, you may feel more pressure on your bladder and pelvic bone and less pressure on your ribs. This may make it easier to breathe. It may also cause you to need to urinate more often and have problems with bowel movements.  Having "practice contractions" (Braxton Hicks contractions) that occur at irregular (unevenly spaced) intervals that are more than 10 minutes apart. This is also called false labor. False labor contractions are common after exercise or sexual activity, and they will stop if you change position, rest, or drink fluids. These contractions are usually mild and do not get stronger over time. They may feel like: ? A backache or back pain. ? Mild cramps, similar to menstrual cramps. ? Tightening or pressure in  your abdomen. Other early symptoms that labor may be starting soon include:  Nausea or loss of appetite.  Diarrhea.  Having a sudden burst of energy, or feeling very tired.  Mood changes.  Having trouble sleeping. How will I know when labor has begun? Signs that true labor has begun may include:  Having contractions that come at regular (evenly spaced) intervals and increase in intensity. This may feel like more intense tightening or pressure in your abdomen that moves to your back. ? Contractions may also feel like rhythmic pain in your upper thighs or back that comes and goes at regular intervals. ? For first-time mothers, this change in intensity of contractions often occurs at a more gradual pace. ? Women who have given birth before may notice a more rapid progression of contraction changes.  Having a feeling of pressure in the vaginal area.  Your water breaking (rupture of membranes). This is when the sac of fluid that surrounds your baby breaks. When this happens, you will notice fluid leaking from your vagina. This may be clear or blood-tinged. Labor usually starts within 24 hours of your water breaking, but it may take longer to begin. ? Some women notice this as a gush of fluid. ? Others notice that their underwear repeatedly becomes damp. Follow these instructions at home:   When labor starts, or if your water breaks, call your health care provider or nurse care line. Based on your situation, they will determine when you should go in for an   exam.  When you are in early labor, you may be able to rest and manage symptoms at home. Some strategies to try at home include: ? Breathing and relaxation techniques. ? Taking a warm bath or shower. ? Listening to music. ? Using a heating pad on the lower back for pain. If you are directed to use heat:  Place a towel between your skin and the heat source.  Leave the heat on for 20-30 minutes.  Remove the heat if your skin turns  bright red. This is especially important if you are unable to feel pain, heat, or cold. You may have a greater risk of getting burned. Get help right away if:  You have painful, regular contractions that are 5 minutes apart or less.  Labor starts before you are [redacted] weeks along in your pregnancy.  You have a fever.  You have a headache that does not go away.  You have bright red blood coming from your vagina.  You do not feel your baby moving.  You have a sudden onset of: ? Severe headache with vision problems. ? Nausea, vomiting, or diarrhea. ? Chest pain or shortness of breath. These symptoms may be an emergency. If your health care provider recommends that you go to the hospital or birth center where you plan to deliver, do not drive yourself. Have someone else drive you, or call emergency services (911 in the [place] Summary  Labor is your body's natural process of moving your baby, placenta, and umbilical cord out of your uterus.  The process of labor usually starts when your baby is full-term, between 75 and 40 weeks of pregnancy.  When labor starts, or if your water breaks, call your health care provider or nurse care line. Based on your situation, they will determine when you should go in for an exam. This information is not intended to replace advice given to you by your health care provider. Make sure you discuss any questions you have with your health care provider. Document Released: 08/28/2016 Document Revised: 12/21/2016 Document Reviewed: 08/28/2016 Elsevier Patient Education  2020 [place] Assessment Unit (MAU) is located at the [facility] and [place] at [facility]. The address is: [address] [place], [place], [place] [address]. Please see map below for additional directions.    The Maternity Assessment Unit is designed to help you during your pregnancy, and for up to 6 weeks after delivery, with any pregnancy- or  postpartum-related emergencies, if you think you are in labor, or if your water has broken. For example, if you experience nausea and vomiting, vaginal bleeding, severe abdominal or pelvic pain, elevated blood pressure or other problems related to your pregnancy or postpartum time, please come to the Maternity Assessment Unit for assistance.

## 2019-02-25 NOTE — Addendum Note (Signed)
Addended by: Vernice Jefferson E on: 02/25/2019 03:09 PM   Modules accepted: Orders, SmartSet

## 2019-02-27 ENCOUNTER — Encounter (HOSPITAL_COMMUNITY): Payer: Self-pay | Admitting: *Deleted

## 2019-02-27 ENCOUNTER — Telehealth (HOSPITAL_COMMUNITY): Payer: Self-pay | Admitting: *Deleted

## 2019-02-27 NOTE — Telephone Encounter (Signed)
Preadmission screen Interpreter number [phone]

## 2019-03-02 ENCOUNTER — Other Ambulatory Visit: Payer: Self-pay | Admitting: Family Medicine

## 2019-03-03 ENCOUNTER — Inpatient Hospital Stay (HOSPITAL_COMMUNITY): Payer: Medicaid Other | Admitting: Anesthesiology

## 2019-03-03 ENCOUNTER — Other Ambulatory Visit: Payer: Self-pay

## 2019-03-03 ENCOUNTER — Encounter (HOSPITAL_COMMUNITY): Payer: Self-pay

## 2019-03-03 ENCOUNTER — Inpatient Hospital Stay (HOSPITAL_COMMUNITY)
Admission: AD | Admit: 2019-03-03 | Discharge: 2019-03-05 | DRG: 768 | Disposition: A | Discharge status: Home / Self Care | Payer: Medicaid Other | Attending: Obstetrics and Gynecology | Admitting: Obstetrics and Gynecology

## 2019-03-03 ENCOUNTER — Encounter (HOSPITAL_COMMUNITY): Payer: Self-pay | Admitting: *Deleted

## 2019-03-03 ENCOUNTER — Inpatient Hospital Stay (EMERGENCY_DEPARTMENT_HOSPITAL)
Admission: AD | Admit: 2019-03-03 | Discharge: 2019-03-03 | Disposition: A | Discharge status: Home / Self Care | Payer: Medicaid Other | Source: Ambulatory Visit | Attending: Obstetrics and Gynecology | Admitting: Obstetrics and Gynecology

## 2019-03-03 DIAGNOSIS — Z603 Acculturation difficulty: Secondary | ICD-10-CM | POA: Diagnosis present

## 2019-03-03 DIAGNOSIS — Z3403 Encounter for supervision of normal first pregnancy, third trimester: Secondary | ICD-10-CM

## 2019-03-03 DIAGNOSIS — Z3A40 40 weeks gestation of pregnancy: Secondary | ICD-10-CM

## 2019-03-03 DIAGNOSIS — O368130 Decreased fetal movements, third trimester, not applicable or unspecified: Secondary | ICD-10-CM

## 2019-03-03 DIAGNOSIS — Z20828 Contact with and (suspected) exposure to other viral communicable diseases: Secondary | ICD-10-CM | POA: Diagnosis present

## 2019-03-03 DIAGNOSIS — O480 Post-term pregnancy: Secondary | ICD-10-CM

## 2019-03-03 DIAGNOSIS — O368930 Maternal care for other specified fetal problems, third trimester, not applicable or unspecified: Secondary | ICD-10-CM

## 2019-03-03 DIAGNOSIS — Z789 Other specified health status: Secondary | ICD-10-CM | POA: Diagnosis present

## 2019-03-03 DIAGNOSIS — O479 False labor, unspecified: Secondary | ICD-10-CM

## 2019-03-03 DIAGNOSIS — Z3689 Encounter for other specified antenatal screening: Secondary | ICD-10-CM

## 2019-03-03 DIAGNOSIS — O471 False labor at or after 37 completed weeks of gestation: Secondary | ICD-10-CM | POA: Diagnosis not present

## 2019-03-03 DIAGNOSIS — O99810 Abnormal glucose complicating pregnancy: Secondary | ICD-10-CM

## 2019-03-03 LAB — CBC
HCT: 39.2 % (ref 36.0–46.0)
Hemoglobin: 13.1 g/dL (ref 12.0–15.0)
MCH: 30 pg (ref 26.0–34.0)
MCHC: 33.4 g/dL (ref 30.0–36.0)
MCV: 89.7 fL (ref 80.0–100.0)
Platelets: 227 10*3/uL (ref 150–400)
RBC: 4.37 MIL/uL (ref 3.87–5.11)
RDW: 14.2 % (ref 11.5–15.5)
WBC: 10.6 10*3/uL — ABNORMAL HIGH (ref 4.0–10.5)
nRBC: 0 % (ref 0.0–0.2)

## 2019-03-03 LAB — TYPE AND SCREEN
ABO/RH(D): O POS
Antibody Screen: NEGATIVE

## 2019-03-03 LAB — RPR: RPR Ser Ql: NONREACTIVE

## 2019-03-03 LAB — SARS CORONAVIRUS 2 (TAT 6-24 HRS): SARS Coronavirus 2: NEGATIVE

## 2019-03-03 MED ORDER — DIPHENHYDRAMINE HCL 25 MG PO CAPS: 25.0000 mg | ORAL_CAPSULE | Freq: Four times a day (QID) | ORAL | Status: DC | PRN

## 2019-03-03 MED ORDER — DIPHENHYDRAMINE HCL 50 MG/ML IJ SOLN: 12.5000 mg | INTRAMUSCULAR | Status: DC | PRN

## 2019-03-03 MED ORDER — LACTATED RINGERS IV SOLN: 500.0000 mL | INTRAVENOUS | Status: DC | PRN

## 2019-03-03 MED ORDER — COCONUT OIL OIL: 1.0000 "application " | TOPICAL_OIL | Status: DC | PRN

## 2019-03-03 MED ORDER — OXYTOCIN 40 UNITS IN NORMAL SALINE INFUSION - SIMPLE MED
2.5000 [IU]/h | INTRAVENOUS | Status: DC
  Filled 2019-03-03: qty 1000

## 2019-03-03 MED ORDER — WITCH HAZEL-GLYCERIN EX PADS: 1.0000 "application " | MEDICATED_PAD | CUTANEOUS | Status: DC | PRN

## 2019-03-03 MED ORDER — ONDANSETRON HCL 4 MG/2ML IJ SOLN: 4.0000 mg | INTRAMUSCULAR | Status: DC | PRN

## 2019-03-03 MED ORDER — OXYCODONE-ACETAMINOPHEN 5-325 MG PO TABS: 1.0000 | ORAL_TABLET | ORAL | Status: DC | PRN

## 2019-03-03 MED ORDER — EPHEDRINE 5 MG/ML INJ: 10.0000 mg | INTRAVENOUS | Status: DC | PRN

## 2019-03-03 MED ORDER — LACTATED RINGERS IV SOLN
INTRAVENOUS | Status: DC
  Administered 2019-03-03: 08:00:00 via INTRAVENOUS

## 2019-03-03 MED ORDER — ACETAMINOPHEN 325 MG PO TABS
650.0000 mg | ORAL_TABLET | Freq: Four times a day (QID) | ORAL | Status: DC | PRN
  Administered 2019-03-04 – 2019-03-05 (×3): 650 mg via ORAL
  Filled 2019-03-03 (×3): qty 2

## 2019-03-03 MED ORDER — TETANUS-DIPHTH-ACELL PERTUSSIS 5-2.5-18.5 LF-MCG/0.5 IM SUSP
0.5000 mL | Freq: Once | INTRAMUSCULAR | Status: AC
  Administered 2019-03-05: 0.5 mL via INTRAMUSCULAR
  Filled 2019-03-03: qty 0.5

## 2019-03-03 MED ORDER — SOD CITRATE-CITRIC ACID 500-334 MG/5ML PO SOLN: 30.0000 mL | ORAL | Status: DC | PRN

## 2019-03-03 MED ORDER — FENTANYL-BUPIVACAINE-NACL 0.5-0.125-0.9 MG/250ML-% EP SOLN: 12.0000 mL/h | EPIDURAL | Status: DC | PRN

## 2019-03-03 MED ORDER — OXYTOCIN 40 UNITS IN NORMAL SALINE INFUSION - SIMPLE MED
1.0000 m[IU]/min | INTRAVENOUS | Status: DC
  Administered 2019-03-03: 2 m[IU]/min via INTRAVENOUS

## 2019-03-03 MED ORDER — SODIUM CHLORIDE (PF) 0.9 % IJ SOLN
INTRAMUSCULAR | Status: DC | PRN
  Administered 2019-03-03: 12 mL/h via EPIDURAL

## 2019-03-03 MED ORDER — TERBUTALINE SULFATE 1 MG/ML IJ SOLN: 0.2500 mg | Freq: Once | INTRAMUSCULAR | Status: DC | PRN

## 2019-03-03 MED ORDER — PHENYLEPHRINE 40 MCG/ML (10ML) SYRINGE FOR IV PUSH (FOR BLOOD PRESSURE SUPPORT): 80.0000 ug | PREFILLED_SYRINGE | INTRAVENOUS | Status: DC | PRN

## 2019-03-03 MED ORDER — FENTANYL-BUPIVACAINE-NACL 0.5-0.125-0.9 MG/250ML-% EP SOLN
EPIDURAL | Status: AC
  Filled 2019-03-03: qty 250

## 2019-03-03 MED ORDER — OXYCODONE-ACETAMINOPHEN 5-325 MG PO TABS: 2.0000 | ORAL_TABLET | ORAL | Status: DC | PRN

## 2019-03-03 MED ORDER — LACTATED RINGERS IV SOLN: 500.0000 mL | Freq: Once | INTRAVENOUS | Status: DC

## 2019-03-03 MED ORDER — BENZOCAINE-MENTHOL 20-0.5 % EX AERO
1.0000 "application " | INHALATION_SPRAY | CUTANEOUS | Status: DC | PRN
  Filled 2019-03-03: qty 56

## 2019-03-03 MED ORDER — ONDANSETRON HCL 4 MG/2ML IJ SOLN
4.0000 mg | Freq: Four times a day (QID) | INTRAMUSCULAR | Status: DC | PRN
  Administered 2019-03-03: 4 mg via INTRAVENOUS
  Filled 2019-03-03: qty 2

## 2019-03-03 MED ORDER — ACETAMINOPHEN 325 MG PO TABS: 650.0000 mg | ORAL_TABLET | ORAL | Status: DC | PRN

## 2019-03-03 MED ORDER — DIBUCAINE (PERIANAL) 1 % EX OINT: 1.0000 "application " | TOPICAL_OINTMENT | CUTANEOUS | Status: DC | PRN

## 2019-03-03 MED ORDER — OXYTOCIN BOLUS FROM INFUSION
500.0000 mL | Freq: Once | INTRAVENOUS | Status: AC
  Administered 2019-03-03: 500 mL via INTRAVENOUS

## 2019-03-03 MED ORDER — SIMETHICONE 80 MG PO CHEW: 80.0000 mg | CHEWABLE_TABLET | ORAL | Status: DC | PRN

## 2019-03-03 MED ORDER — SENNOSIDES-DOCUSATE SODIUM 8.6-50 MG PO TABS
2.0000 | ORAL_TABLET | ORAL | Status: DC
  Administered 2019-03-04 – 2019-03-05 (×2): 2 via ORAL
  Filled 2019-03-03 (×3): qty 2

## 2019-03-03 MED ORDER — ONDANSETRON HCL 4 MG PO TABS: 4.0000 mg | ORAL_TABLET | ORAL | Status: DC | PRN

## 2019-03-03 MED ORDER — METHYLERGONOVINE MALEATE 0.2 MG/ML IJ SOLN
INTRAMUSCULAR | Status: AC
  Filled 2019-03-03: qty 1

## 2019-03-03 MED ORDER — PRENATAL MULTIVITAMIN CH
1.0000 | ORAL_TABLET | Freq: Every day | ORAL | Status: DC
  Administered 2019-03-04: 1 via ORAL
  Filled 2019-03-03: qty 1

## 2019-03-03 MED ORDER — LIDOCAINE HCL (PF) 1 % IJ SOLN: 30.0000 mL | INTRAMUSCULAR | Status: DC | PRN

## 2019-03-03 MED ORDER — LIDOCAINE HCL (PF) 1 % IJ SOLN
INTRAMUSCULAR | Status: DC | PRN
  Administered 2019-03-03 (×2): 4 mL via EPIDURAL

## 2019-03-03 MED ORDER — MISOPROSTOL 25 MCG QUARTER TABLET
25.0000 ug | ORAL_TABLET | ORAL | Status: DC | PRN
  Filled 2019-03-03: qty 1

## 2019-03-03 MED ORDER — MEASLES, MUMPS & RUBELLA VAC IJ SOLR: 0.5000 mL | Freq: Once | INTRAMUSCULAR | Status: DC

## 2019-03-03 MED ORDER — IBUPROFEN 600 MG PO TABS
600.0000 mg | ORAL_TABLET | Freq: Three times a day (TID) | ORAL | Status: DC | PRN
  Administered 2019-03-03 – 2019-03-04 (×2): 600 mg via ORAL
  Filled 2019-03-03 (×2): qty 1

## 2019-03-03 MED ORDER — POLYETHYLENE GLYCOL 3350 17 G PO PACK
17.0000 g | PACK | Freq: Every day | ORAL | Status: DC
  Administered 2019-03-04 – 2019-03-05 (×2): 17 g via ORAL
  Filled 2019-03-03 (×3): qty 1

## 2019-03-03 NOTE — Progress Notes (Signed)
Arletha Marschke is a 23 y.o. G1P0 at [redacted]w[redacted]d admitted for ROM with meconium stain.  Subjective: Comfortable with epidural in place.  No complaints voiced. FOB at bedside.  Objective: BP 116/74   Pulse 98   Temp 98.3 F (36.8 C) (Oral)   Resp 16   Ht 5' 7.32" (1.71 m)   Wt 112 kg   LMP 05/26/2018 (Exact Date)   SpO2 100%   BMI 38.32 kg/m  Total I/O In: -  Out: 600 [Urine:600]  FHT:  FHR: 135-145 bpm, variability: moderate,  accelerations:  Present,  decelerations:  Present reassuring that returning to baseline UC:   Moderate on Toco  SVE:   Dilation: 8 Effacement (%): 90 Station: 0 Exam by:: Delorise Shiner, RN   Pitocin @ 6 mu/min  Labs: Lab Results  Component Value Date   WBC 10.6 (H) 03/03/2019   HGB 13.1 03/03/2019   HCT 39.2 03/03/2019   MCV 89.7 03/03/2019   PLT 227 03/03/2019    Assessment / Plan: Anise Harbin is a 23 y.o G1P0 at [redacted]w[redacted]d here for IOL due to ROM with meconium staining  Labor: Progressing on Pitocin.  Continue to titrate. Fetal Wellbeing:  Category II Pain Control:  Epidural I/D:  GBS negative Anticipated MOD:  Vaginal Delivery, CS as appropriate  Carollee Leitz MD PGY1 [facility] 03/03/2019, 2:12 PM

## 2019-03-03 NOTE — MAU Note (Signed)
Pt reports lower abdominal cramping that started around noon today. Pt reports the pain comes and goes every 5-10 minutes. Reports good fetal movement all day, but since 7pm she reports she has not felt any movement. Denies vaginal bleeding or LOF.

## 2019-03-03 NOTE — Progress Notes (Addendum)
Marissa Reed is a 23 y.o. G1P0 at [redacted]w[redacted]d admitted for induction of labor due to ROM with meconium stain.  Subjective: Comfortable with epidural in place. FOB at bedside.  Objective: BP (!) 108/57   Pulse 100   Temp 98.3 F (36.8 C) (Oral)   Resp 17   Ht 5' 7.32" (1.71 m)   Wt 112 kg   LMP 05/26/2018 (Exact Date)   SpO2 100%   BMI 38.32 kg/m  Total I/O In: -  Out: 250 [Urine:250]  FHT:  FHR: 125-130 bpm, variability: moderate,  accelerations:  Present,  decelerations:  Variable and returning to baseline UC:   Minimal on Toco  SVE:   Dilation: 5.5 Effacement (%): 80 Station: -1 Exam by:: Delorise Shiner, RN   Pitocin @ 2 mu/min  Labs: Lab Results  Component Value Date   WBC 10.6 (H) 03/03/2019   HGB 13.1 03/03/2019   HCT 39.2 03/03/2019   MCV 89.7 03/03/2019   PLT 227 03/03/2019    Assessment / Plan: Marissa Reed is a 23 y.o G1P0 at [redacted]w[redacted]d here for IOL due to ROM with meconium staining  Labor: s/p Pitocin. Continue to titrate Pitocin. Fetal Wellbeing:  Category II, variable decelerations, reassuring for returning to baseline Pain Control:  Epidural I/D:  GBS negative Anticipated MOD:  Vaginal Delivery, CS as appropriate  Carollee Leitz MD PGY1 [facility] 03/03/2019, 12:21 PM

## 2019-03-03 NOTE — Discharge Summary (Signed)
Postpartum Discharge Summary      Patient Name: Marissa Reed DOB: 16-May-1995 MRN: [id number]  Date of admission: 03/03/2019 Delivering Provider: Chauncey Mann   Date of discharge: 03/05/2019  Admitting diagnosis: 54 WKS, CTX Intrauterine pregnancy: [redacted]w[redacted]d    Secondary diagnosis:  Active Problems:   Language barrier affecting health care   Type 3a perineal laceration  Additional problems: None     Discharge diagnosis: Term Pregnancy Delivered                                                                                                Post partum procedures:None  Augmentation: Pitocin  Complications: None  Hospital course:  Onset of Labor With Vaginal Delivery     23y.o. yo G1P0 at 445w1das admitted in Latent Labor on 03/03/2019. Patient had an uncomplicated labor course as follows. Initial SVE: 3/80/-2. Patient received Pitocin and epidural. She then progressed to complete with uncomplicated delivery. Membrane Rupture Time/Date: 5:40 AM ,03/03/2019   Intrapartum Procedures: Episiotomy: None [1]                                         Lacerations:  3rd degree [4]  Patient had a delivery of a Viable infant. 03/03/2019  Information for the patient's newborn:  ToTaiz, Bickle0[[id number]]Delivery Method: Vaginal, Spontaneous(Filed from Delivery Summary)     Pateint had an uncomplicated postpartum course.  She is ambulating, tolerating a regular diet, passing flatus, and urinating well. Patient is discharged home in stable condition on 03/05/19.  Delivery time: 4:16 PM    Magnesium Sulfate received: No BMZ received: No Rhophylac:N/A MMR:N/A Transfusion:No  Physical exam  Vitals:   03/04/19 0356 03/04/19 1529 03/04/19 2230 03/05/19 0523  BP: 101/61 98/68 (!) 107/59 94/66  Pulse: (!) 101 91 87 76  Resp: _0 Temp: 98.3 F (36.8 C) 98.5 F (36.9 C) 99 F (37.2 C) 98.2 F (36.8 C)  TempSrc: Oral Oral Oral Oral  SpO2: 100%  100% 97%  Weight:       Height:       General: alert and cooperative Lochia: appropriate Uterine Fundus: firm Incision: N/A DVT Evaluation: No evidence of DVT seen on physical exam. Labs: Lab Results  Component Value Date   WBC 10.6 (H) 03/03/2019   HGB 13.1 03/03/2019   HCT 39.2 03/03/2019   MCV 89.7 03/03/2019   PLT 227 03/03/2019   No flowsheet data found.  Discharge instruction: per After Visit Summary and "Baby and Me Booklet".  After visit meds:  Allergies as of 03/05/2019   No Known Allergies     Medication List    STOP taking these medications   promethazine 25 MG tablet Commonly known as: PHENERGAN     TAKE these medications   ibuprofen 800 MG tablet Commonly known as: ADVIL Take 1 tablet (800 mg total) by mouth every 8 (eight) hours as needed.   PNV PO Take by mouth.  Diet: routine diet  Activity: Advance as tolerated. Pelvic rest for 6 weeks.   Outpatient follow GX:QJJHERDEYC check in 2 wks; PP visit in 4 wks Follow up Appt: Future Appointments  Date Time Provider [place]  03/06/2019  1:45 PM Sloan Leiter, MD [place] None  03/07/2019  9:00 AM [facility] None   Follow up Visit: [place]. Schedule an appointment as soon as possible for a visit in 4 week(s).   Why: for your postpartum appointment; you will also have a check of your laceration in 2 weeks Contact information: [address] [place] [address] [phone]           Please schedule this patient for Postpartum visit in: 4 weeks with the following provider: Any provider Please schedule in 2 weeks for incision check for 3a tear Low risk pregnancy complicated by: none Delivery mode:  SVD Anticipated Birth Control:  Condoms PP Procedures needed: Incision check in 2 weeks Schedule Integrated BH visit: no      Newborn Data: Live born female  Birth Weight: 3545gm (7lb 13oz) APGAR: 8, 7  Newborn  Delivery   Birth date/time: 03/03/2019 16:16:00 Delivery type: Vaginal, Spontaneous      Baby Feeding: Breast Disposition:home with mother   03/05/2019 Myrtis Ser, CNM  9:28 AM

## 2019-03-03 NOTE — Progress Notes (Signed)
Marissa Reed is a 23 y.o. G1P0 at [redacted]w[redacted]d admitted for ROM with meconium stain  Subjective: Comfortable with epidural in place.   Objective: BP 117/76   Pulse (!) 106   Temp 98.4 F (36.9 C) (Oral)   Resp 18   Ht 5' 7.32" (1.71 m)   Wt 112 kg   LMP 05/26/2018 (Exact Date)   SpO2 100%   BMI 38.32 kg/m  Total I/O In: -  Out: 950 [Urine:950]  FHT:  FHR: 140-150 bpm, variability: moderate,  accelerations:  Present,  decelerations:  Absent UC:   Moderate on Toco  SVE:   Dilation: 10 Effacement (%): 100 Station: Plus 1, Plus 2 Exam by:: Drs Marice Potter and Volanda Napoleon  Pitocin @ 6 mu/min  Labs: Lab Results  Component Value Date   WBC 10.6 (H) 03/03/2019   HGB 13.1 03/03/2019   HCT 39.2 03/03/2019   MCV 89.7 03/03/2019   PLT 227 03/03/2019    Assessment / Plan: Marissa Reed is a 23 y.o G1P0 at [redacted]w[redacted]d here for IOL due to ROM with meconium staining  Labor: Progressing on Pitocin.  Continue to titrae as appropriate Fetal Wellbeing:  Category I Pain Control:  Epidural I/D:  GBS negative Anticipated MOD:  Vaginal Delivery, CS as appropriate  Carollee Leitz MD PGY1 [facility] 03/03/2019, 3:57 PM

## 2019-03-03 NOTE — Lactation Note (Addendum)
This note was copied from a baby's chart. Lactation Consultation Note  Patient Name: Marissa Reed OEUMP'N Date: 03/03/2019 Reason for consult: Initial assessment;1st time breastfeeding;Term;Difficult latch P1, 4 hour female infant. Mom's feeding choice is breast and formula feeding. Morning shift Nurse discussed LEAD with parents  and supplementing sheet with   EBM/ and or formula with breastfeeding was given.  Interpreter used# Wyvonnia Lora [id number] and Katharine Look [phone]. Per mom, she doesn't receive WIC nor have manual hand pump. Mom was shown how to hand express but colostrum is not  currently present. LC noticed mom breast are slight short shafted and becomes flat when compressed. After multiple attempts of latching infant at breast a 20 mm NS was used.  Mom  pre-pumped breast with hand pump, applied 20 mm NS and latched infant on right breast using the football hold. Infant did not sustained latched until 0.5 ml of Gerber formula with iron was given  in  20 mm NS and infant started suckling in a  rthymitic pattern and sustained latch for 25 minutes.   Infant took 6 mls of formula  at breast with a curve tip syringe and 2 mls on a gloved finger using curve tip syringe. Mom will breastfeed infant first according to hunger cues, 8 to 12 times within 24 hours and on demand. Mom plans to supplement with formula afterwards using the  curve tip syringe or foley cup within first 24 hours of life. Mom will use NS 20 mm the first 24 hours and work on infant's latch and mom understands NS is for short term use. Parents will do as much STS as possible. Mom will call Nurse or [place] if she has any questions, concerns or need assistance with latching infant to breast.  Mom shown how to use harmony hand pump & how to disassemble, clean, & reassemble parts. Mom made aware of O/P services, breastfeeding support groups, community resources, and our phone # for post-discharge questions.  Maternal Data Formula Feeding for  Exclusion: Yes Reason for exclusion: Mother's choice to formula and breast feed on admission Does the patient have breastfeeding experience prior to this delivery?: No  Feeding Feeding Type: Breast Fed  LATCH Score Latch: Repeated attempts needed to sustain latch, nipple held in mouth throughout feeding, stimulation needed to elicit sucking reflex.  Audible Swallowing: Spontaneous and intermittent  Type of Nipple: Everted at rest and after stimulation(Becomes flat when compressed)  Comfort (Breast/Nipple): Soft / non-tender  Hold (Positioning): Assistance needed to correctly position infant at breast and maintain latch.  LATCH Score: 8  Interventions Interventions: Breast feeding basics reviewed;Assisted with latch;Skin to skin;Breast massage;Hand express;Pre-pump if needed;Adjust position;Support pillows;Position options;Expressed milk;Hand pump  Lactation Tools Discussed/Used Tools: Pump;Nipple Shields;42F feeding tube / Syringe Nipple shield size: 20 Breast pump type: Manual WIC Program: No Pump Review: Setup, frequency, and cleaning;Milk Storage Initiated by:: Vicente Serene, IBCLC) Date initiated:: 03/03/19   Consult Status Consult Status: Follow-up Date: 03/03/19 Follow-up type: In-patient    Vicente Serene 03/03/2019, 9:07 PM

## 2019-03-03 NOTE — Progress Notes (Signed)
Interpreter in use

## 2019-03-03 NOTE — Anesthesia Preprocedure Evaluation (Signed)
Anesthesia Evaluation  Patient identified by MRN, date of birth, ID band Patient awake    Reviewed: Allergy & Precautions, Patient's Chart, lab work & pertinent test results  History of Anesthesia Complications Negative for: history of anesthetic complications  Airway Mallampati: II  TM Distance: >3 FB Neck ROM: Full    Dental no notable dental hx.    Pulmonary neg pulmonary ROS,    Pulmonary exam normal        Cardiovascular negative cardio ROS Normal cardiovascular exam     Neuro/Psych negative neurological ROS  negative psych ROS   GI/Hepatic negative GI ROS, Neg liver ROS,   Endo/Other  negative endocrine ROS  Renal/GU negative Renal ROS  negative genitourinary   Musculoskeletal negative musculoskeletal ROS (+)   Abdominal   Peds  Hematology negative hematology ROS (+)   Anesthesia Other Findings Day of surgery medications reviewed with patient.  Reproductive/Obstetrics (+) Pregnancy                             Anesthesia Physical Anesthesia Plan  ASA: II  Anesthesia Plan: Epidural   Post-op Pain Management:    Induction:   PONV Risk Score and Plan: Treatment may vary due to age or medical condition  Airway Management Planned: Natural Airway  Additional Equipment:   Intra-op Plan:   Post-operative Plan:   Informed Consent: I have reviewed the patients History and Physical, chart, labs and discussed the procedure including the risks, benefits and alternatives for the proposed anesthesia with the patient or authorized representative who has indicated his/her understanding and acceptance.       Plan Discussed with:   Anesthesia Plan Comments:         Anesthesia Quick Evaluation  

## 2019-03-03 NOTE — Progress Notes (Addendum)
Marissa Reed is a 23 y.o. G1P0 at [redacted]w[redacted]d admitted for induction of labor due to ROM with meconium  Subjective: Comfortable with epidural in place.  No concerns voiced. FOB at bedside.  Objective: BP 105/66   Pulse (!) 107   Temp 98.3 F (36.8 C) (Oral)   Resp 18   Ht 5' 7.32" (1.71 m)   Wt 112 kg   LMP 05/26/2018 (Exact Date)   SpO2 100%   BMI 38.32 kg/m  No intake/output data recorded.  FHT:  FHR: 135 bpm, variability: moderate,  accelerations:  Present,  decelerations:  Absent UC:   Moderate on Toco  SVE:   Dilation: 3 Effacement (%): 80 Station: -2, -1 Exam by:: Delorise Shiner, RN   Labs: Lab Results  Component Value Date   WBC 10.6 (H) 03/03/2019   HGB 13.1 03/03/2019   HCT 39.2 03/03/2019   MCV 89.7 03/03/2019   PLT 227 03/03/2019    Assessment / Plan: Marissa Reed is a 23 y.o G1P0 at [redacted]w[redacted]d here for IOL due to ROM with meconium staining  Labor: Start Pitocin.  Consider FB at next check. Fetal Wellbeing:  Category I Pain Control:  Epidural I/D:  GBS negative Anticipated MOD:  Vaginal Delivery, CS as appropriate  Carollee Leitz MD PGY1 [facility] 03/03/2019, 8:45 AM

## 2019-03-03 NOTE — Anesthesia Procedure Notes (Signed)
Epidural Patient location during procedure: OB Start time: 03/03/2019 7:24 AM End time: 03/03/2019 7:27 AM  Staffing Anesthesiologist: Brennan Bailey, MD Performed: anesthesiologist   Preanesthetic Checklist Completed: patient identified, pre-op evaluation, timeout performed, IV checked, risks and benefits discussed and monitors and equipment checked  Epidural Patient position: sitting Prep: site prepped and draped and DuraPrep Patient monitoring: continuous pulse ox, blood pressure and heart rate Approach: midline Location: L3-L4 Injection technique: LOR air  Needle:  Needle type: Tuohy  Needle gauge: 17 G Needle length: 9 cm Needle insertion depth: 8 cm Catheter type: closed end flexible Catheter size: 19 Gauge Catheter at skin depth: 13 cm Test dose: negative and Other (1% lidocaine)  Assessment Events: blood not aspirated, injection not painful, no injection resistance, negative IV test and no paresthesia  Additional Notes Patient identified. Risks, benefits, and alternatives discussed with patient including but not limited to bleeding, infection, nerve damage, paralysis, failed block, incomplete pain control, headache, blood pressure changes, nausea, vomiting, reactions to medication, itching, and postpartum back pain. Confirmed with bedside nurse the patient's most recent platelet count. Confirmed with patient that they are not currently taking any anticoagulation, have any bleeding history, or any family history of bleeding disorders. Patient expressed understanding and wished to proceed. All questions were answered. Sterile technique was used throughout the entire procedure. Please see nursing notes for vital signs.   Crisp LOR on first pass. Test dose was given through epidural catheter and negative prior to continuing to dose epidural or start infusion. Warning signs of high block given to the patient including shortness of breath, tingling/numbness in hands, complete  motor block, or any concerning symptoms with instructions to call for help. Patient was given instructions on fall risk and not to get out of bed. All questions and concerns addressed with instructions to call with any issues or inadequate analgesia.  Reason for block:procedure for pain

## 2019-03-03 NOTE — H&P (Signed)
Marissa Reed is a 23 y.o. female G1P0 at [redacted]w[redacted]d pt of [facility] presenting for active labor and ROM with meconium.  She presented to MAU for labor evaluation 03/02/19 in the evening and was discharged without cervical change. She presented again with more painful contractions and had rupture of membranes while in MAU being evaluated. Pregnancy has been uncomplicated.  Arabic language interpreter on video line used for all communication.   Nursing Staff Provider  Office Location  [facility] Dating  LMP  Language  Arabic Anatomy US  Wnl, incomplete, repeat in 4 weeks - normal  Flu Vaccine  Declined 12-16-18 Genetic Screen  NIPS: low risk female  AFP:   wnl  TDaP vaccine   info given 12-16-18 Hgb A1C or  GTT Early  Third trimester 1 hr 146 Nml 3 hr GTT  Rhogam  NA   LAB RESULTS   Feeding Plan Both breast and bottle  Blood Type O/Positive/-- (05/04 1009)   Contraception condoms Antibody Negative (05/04 1009)  Circumcision Yes Rubella 4.13 (05/04 1009)  Pediatrician  List given  RPR Non Reactive (09/18 0835)   Support Person Husband/FOB Ahmed HBsAg Negative (05/04 1009)   Prenatal Classes no HIV Non Reactive (09/18 0835)  BTL Consent NA GBS   neg  VBAC Consent NA Pap Inadequate, needs repeat    Hgb Electro  Neg [place]    CF Neg [facility]    SMA Neg [place]    Waterbirth  [ ]  Class [ ]  Consent [ ]  CNM visit   OB History    Gravida  1   Para      Term      Preterm      AB      Living        SAB      TAB      Ectopic      Multiple      Live Births             History reviewed. No pertinent past medical history. Past Surgical History:  Procedure Laterality Date  . TONSILLECTOMY     Family History: family history includes Diabetes in her father and mother. Social History:  reports that she has never smoked. She has never used smokeless tobacco. She reports that she does not drink alcohol or use drugs.     Maternal Diabetes: No Genetic Screening: Normal Maternal  Ultrasounds/Referrals: Normal Fetal Ultrasounds or other Referrals:  None Maternal Substance Abuse:  No Significant Maternal Medications:  None Significant Maternal Lab Results:  Group B Strep negative Other Comments:  None  Review of Systems  Constitutional: Negative for chills and fever.  Respiratory: Negative for shortness of breath.   Cardiovascular: Negative for chest pain.  Gastrointestinal: Positive for abdominal pain. Negative for vomiting.  Neurological: Negative for dizziness and headaches.   Maternal Medical History:  Reason for admission: Rupture of membranes and contractions.   Contractions: Onset was 6-12 hours ago.   Frequency: regular.   Perceived severity is strong.    Fetal activity: Perceived fetal activity is normal.   Last perceived fetal movement was within the past hour.    Prenatal complications: no prenatal complications Prenatal Complications - Diabetes: none.    Dilation: 3 Effacement (%): 80 Station: -2 Exam by:: RN  Last menstrual period 05/26/2018. Maternal Exam:  Uterine Assessment: Contraction strength is moderate.  Contraction frequency is regular.   Abdomen: Fetal presentation: vertex  Cervix: Cervix evaluated by digital exam.  Fetal Exam Fetal Monitor Review: Mode: ultrasound.   Baseline rate: 140.  Variability: moderate (6-25 bpm).   Pattern: accelerations present and no decelerations.    Fetal State Assessment: Category I - tracings are normal.     Physical Exam  Nursing note and vitals reviewed. Constitutional: She is oriented to person, place, and time. She appears well-developed and well-nourished.  Neck: Normal range of motion.  Cardiovascular: Normal rate, regular rhythm and normal heart sounds.  Respiratory: Effort normal and breath sounds normal.  GI: Soft.  Musculoskeletal: Normal range of motion.  Neurological: She is alert and oriented to person, place, and time.  Skin: Skin is warm and dry.   Psychiatric: She has a normal mood and affect. Her behavior is normal. Judgment and thought content normal.    Prenatal labs: ABO, Rh: O/Positive/-- (05/04 1009) Antibody: Negative (05/04 1009) Rubella: 4.13 (05/04 1009) RPR: Non Reactive (09/18 0835)  HBsAg: Negative (05/04 1009)  HIV: Non Reactive (09/18 0835)  GBS: Negative/-- (10/30 7902)   Assessment/Plan: G1P0 at [redacted]w[redacted]d admitted for active labor and ROM with meconium stained fluid GBS negative  Admit to L&D Epidural when desired Anticipate NSVD   Fatima Blank 03/03/2019, 6:04 AM

## 2019-03-03 NOTE — MAU Provider Note (Signed)
Chief Complaint:  Contractions   First Provider Initiated Contact with Patient 03/03/19 0141      HPI: Marissa Reed is a 23 y.o. G1P0 at [redacted]w[redacted]d by LMP who presents to maternity admissions reporting onset of contractions around noon today. She reports good fetal movement earlier today but no movement since 7 pm, for approximately 6 hours.  She reports contractions are every 5-10 minutes and are more intense than when they started earlier today. There are no other symptoms. She has not tried any treatments.    HPI  Past Medical History: History reviewed. No pertinent past medical history.  Past obstetric history: OB History  Gravida Para Term Preterm AB Living  1            SAB TAB Ectopic Multiple Live Births               # Outcome Date GA Lbr Len/2nd Weight Sex Delivery Anes PTL Lv  1 Current             Past Surgical History: Past Surgical History:  Procedure Laterality Date  . TONSILLECTOMY      Family History: Family History  Problem Relation Age of Onset  . Diabetes Mother   . Diabetes Father     Social History: Social History   Tobacco Use  . Smoking status: Never Smoker  . Smokeless tobacco: Never Used  Substance Use Topics  . Alcohol use: Never    Frequency: Never  . Drug use: Never    Allergies: No Known Allergies  Meds:  Medications Prior to Admission  Medication Sig Dispense Refill Last Dose  . Prenatal Vit w/Fe-Methylfol-FA (PNV PO) Take by mouth.   03/02/2019 at Unknown time  . promethazine (PHENERGAN) 25 MG tablet Take 1 tablet (25 mg total) by mouth every 6 (six) hours as needed for nausea or vomiting. Take one before your next clinic visit 6 tablet 0     ROS:  Review of Systems  Constitutional: Negative for chills, fatigue and fever.  Eyes: Negative for visual disturbance.  Respiratory: Negative for shortness of breath.   Cardiovascular: Negative for chest pain.  Gastrointestinal: Positive for abdominal pain. Negative for nausea and  vomiting.  Genitourinary: Negative for difficulty urinating, dysuria, flank pain, pelvic pain, vaginal bleeding, vaginal discharge and vaginal pain.  Musculoskeletal: Positive for back pain.  Neurological: Negative for dizziness and headaches.  Psychiatric/Behavioral: Negative.      I have reviewed patient's Past Medical Hx, Surgical Hx, Family Hx, Social Hx, medications and allergies.   Physical Exam   Patient Vitals for the past 24 hrs:  BP Temp Temp src Pulse Resp SpO2 Weight  03/03/19 0114 102/66 - - (!) 103 - - -  03/03/19 0049 137/80 - - (!) 104 - - -  03/03/19 0047 137/80 98.6 F (37 C) Oral (!) 107 18 100 % -  03/03/19 0039 - - - - - - 112.2 kg   Constitutional: Well-developed, well-nourished female in no acute distress.  Cardiovascular: normal rate Respiratory: normal effort GI: Abd soft, non-tender, gravid appropriate for gestational age.  MS: Extremities nontender, no edema, normal ROM Neurologic: Alert and oriented x 4.  GU: Neg CVAT.   Dilation: 1 Effacement (%): 50 Station: -3 Exam by:: Lauren Cox RN   FHT:  Baseline 140 , moderate variability, accelerations present, no decelerations. One episode of fetal movement at 0141 with halving fetal heartrate on EFM, no evidence of deceleration with Category I tracing before and after this episode.  Contractions: q 1.5-3 mins, mild to palpation   Labs: No results found for this or any previous visit (from the past 24 hour(s)). O/Positive/-- (05/04 1009)  Imaging:  No results found.  MAU Course/MDM: Orders Placed This Encounter  Procedures  . Discharge patient    No orders of the defined types were placed in this encounter.    NST reviewed.  Accelerations present when monitor initially applied, then moderate variability without accelerations. Pt given cold water and afterwards FHR tracing with accelerations and pt feeling good fetal movement. Cervix unchanged in 1+ hour in MAU Pt given opportunity to stay 1  additional hour for recheck, or go home and return if contractions strengthen. Pt prefers to go home.   Labor precautions and fetal kick counts reviewed Pt to keep appt 03/06/19 in the office, return to MAU with signs of labor.    Assessment: 1. Braxton Hicks contractions   2. Decreased fetal movements in third trimester, single or unspecified fetus   3. NST (non-stress test) reactive   4. Language barrier affecting health care     Plan: Discharge home Labor precautions and fetal kick counts Follow-up Information    [facility] Follow up.   Why: On 03/06/19 as scheduled. Return to MAU for signs of labor or emergencies. Contact information: [address] [place] [address] [phone]         Allergies as of 03/03/2019   No Known Allergies     Medication List    TAKE these medications   PNV PO Take by mouth.   promethazine 25 MG tablet Commonly known as: PHENERGAN Take 1 tablet (25 mg total) by mouth every 6 (six) hours as needed for nausea or vomiting. Take one before your next clinic visit       Sharen Counter Certified Nurse-Midwife 03/03/2019 2:22 AM

## 2019-03-03 NOTE — Discharge Instructions (Signed)
Reasons to return to MAU at [facility]:  1.  Contractions are  5 minutes apart or less, each last 1 minute, these have been going on for 1-2 hours, and you cannot walk or talk during them 2.  You have a large gush of fluid, or a trickle of fluid that will not stop and you have to wear a pad 3.  You have bleeding that is bright red, heavier than spotting--like menstrual bleeding (spotting can be normal in early labor or after a check of your cervix) 4.  You do not feel the baby moving like he/she normally does  

## 2019-03-03 NOTE — MAU Note (Signed)
Pt reports to MAU c/o SROM @ 0540 meconium fluid. Pt reports ctx every few min. Pt reports no FM since she was in here a few hours ago.

## 2019-03-04 LAB — ABO/RH: ABO/RH(D): O POS

## 2019-03-04 MED ORDER — KETOROLAC TROMETHAMINE 30 MG/ML IJ SOLN
30.0000 mg | Freq: Four times a day (QID) | INTRAMUSCULAR | Status: DC | PRN
  Administered 2019-03-04 – 2019-03-05 (×3): 30 mg via INTRAVENOUS
  Filled 2019-03-04 (×3): qty 1

## 2019-03-04 MED ORDER — KETOROLAC TROMETHAMINE 30 MG/ML IJ SOLN
30.0000 mg | Freq: Once | INTRAMUSCULAR | Status: AC
  Administered 2019-03-04: 30 mg via INTRAVENOUS
  Filled 2019-03-04: qty 1

## 2019-03-04 NOTE — Progress Notes (Addendum)
POSTPARTUM PROGRESS NOTE  Subjective: Marissa Reed is a 23 y.o. G1P1001 s/p Vaginal Delivery at [redacted]w[redacted]d.  She reports she doing well. No acute events overnight. She denies any problems with ambulating, voiding or po intake. Denies nausea or vomiting. She has passed flatus. Pain is poorly controlled.  Lochia is appropriate.  Objective: Blood pressure 101/61, pulse (!) 101, temperature 98.3 F (36.8 C), temperature source Oral, resp. rate 19, height 5' 7.32" (1.71 m), weight 112 kg, last menstrual period 05/26/2018, SpO2 100 %, unknown if currently breastfeeding.  Physical Exam:  General: alert, cooperative and no distress Chest: no respiratory distress Abdomen: soft, non-tender  Uterine Fundus: firm, appropriately tender Extremities: No calf swelling or tenderness  no edema  Recent Labs    03/03/19 0607  HGB 13.1  HCT 39.2    Assessment/Plan: Marissa Reed is a 23 y.o. G1P1001 s/p Vaqinal Delivery at [redacted]w[redacted]d for IOL for ROM with Meconium stain.  Routine Postpartum Care: Doing well, pain not well-controlled, will try Toradol 30 mg IV x 1.  -- Continue routine care, lactation support  -- Contraception: condoms -- Feeding: breast  Dispo: Plan for discharge tomorrow.  Carollee Leitz MD [facility]   CNM attestation Post Partum Day #1 I have seen and examined this patient and agree with above documentation in the resident's note.   Marissa Reed is a 23 y.o. G1P1001 s/p vag del.  Pt reports that pain from lac is much improved; no problems with ambulating, voiding or po intake. Pain is well controlled.  Plan for birth control is condoms.  Method of Feeding: breast  PE:  BP 98/68 (BP Location: Right Arm)   Pulse 91   Temp 98.5 F (36.9 C) (Oral)   Resp 18   Ht 5' 7.32" (1.71 m)   Wt 112 kg   LMP 05/26/2018 (Exact Date)   SpO2 100%   Breastfeeding Unknown   BMI 38.32 kg/m  Fundus firm  Plan for discharge: 03/05/19  Myrtis Ser,  CNM 3:43 PM  03/04/2019

## 2019-03-04 NOTE — Anesthesia Postprocedure Evaluation (Signed)
Anesthesia Post Note  Patient: Marissa Reed  Procedure(s) Performed: AN AD HOC LABOR EPIDURAL     Patient location during evaluation: Mother Baby Anesthesia Type: Epidural Level of consciousness: awake and alert Pain management: pain level controlled Vital Signs Assessment: post-procedure vital signs reviewed and stable Respiratory status: spontaneous breathing, nonlabored ventilation and respiratory function stable Cardiovascular status: stable Postop Assessment: no headache, no backache and epidural receding Anesthetic complications: no    Last Vitals:  Vitals:   03/04/19 0021 03/04/19 0356  BP: (!) 93/50 101/61  Pulse: 98 (!) 101  Resp: 17 19  Temp: 36.9 C 36.8 C  SpO2: 100% 100%    Last Pain:  Vitals:   03/04/19 0356  TempSrc: Oral  PainSc:    Pain Goal:                   Rayvon Char

## 2019-03-04 NOTE — Lactation Note (Signed)
This note was copied from a baby's chart. Lactation Consultation Note  Patient Name: Marissa Reed [id number]'[id number] Date: 03/04/2019 Reason for consult: Mother's request;1st time breastfeeding;Term;Infant weight loss P1, 31 hour female infant, weight loss -2%. Interpreter used : Nael # [id number]  Per mom, she latched infant only once since last night when seen by [facility] serivces and she wants help with  Breastfeeding  infant. LC discussed with parents to tell Nurse she want help  with breastfeeding and going forward Dad said we would let Nurse know. Per dad, he has been giving infant 15 mls of formula per feeding due infant not being latched at breast. LC had mom practice over three times in applying  the 24 mm NS to breast. Mom pre-pumped breast and fitted with 24 mm NS which infant seem to latched better with and swallows were observed and the NS was not pre-filled with formula like previously. Infant sustained latch and was still breastfeeding after 10 minutes when [place] left room.  Mom's current plan: 1. Will start latching infant first to breast for every feeding according to hunger cues, 8 to 12 times within 24 hours and on demand. 2. Dad will supplement infant with formula afterwards using slow flow bottle nipple according to infant's age and hours of life offering ( 7-12 mls per feeding) at 24-48 hours of life. 3. Parents will ask Nurse or LC for assistance  with latching infant to breast if needed. Maternal Data    Feeding Feeding Type: Breast Fed  LATCH Score Latch: Grasps breast easily, tongue down, lips flanged, rhythmical sucking.  Audible Swallowing: Spontaneous and intermittent  Type of Nipple: Flat  Comfort (Breast/Nipple): Soft / non-tender  Hold (Positioning): Assistance needed to correctly position infant at breast and maintain latch.  LATCH Score: 8  Interventions Interventions: Adjust position;Support pillows;Skin to skin;Position options;Pre-pump if needed(24 mm  NS)  Lactation Tools Discussed/Used     Consult Status Consult Status: Follow-up Date: 03/05/19 Follow-up type: In-patient    Vicente Serene 03/04/2019, 11:56 PM

## 2019-03-04 NOTE — Lactation Note (Signed)
This note was copied from a baby's chart. Lactation Consultation Note  Patient Name: Marissa Reed YHOOI'L Date: 03/04/2019   I had been informed that Mom did not want to attempt breastfeeding and only wanted to formula feed until her milk came to volume. I noted that the formula provided was not halal.   I used female Arabic interpreter, Maysa, ID# [phone] to explain that I was going to give them halal formula. While there, I clarified Mom's most recent feeding intention. Parents said that Mom wanted to do both, but her milk had not come in, yet. I explained that I could help her to obtain her colostrum, but I gave Mom the opportunity to say if she wanted me to return at a later time since she looked as if she had been trying to rest.  Mom said she would like for me to return. Through the interpreter, I explained to parents how Mom could get [place] to return when she was ready. Parents verbalized understanding.    Matthias Hughs [facility] 03/04/2019, 12:48 PM

## 2019-03-05 ENCOUNTER — Encounter (HOSPITAL_COMMUNITY): Payer: Self-pay | Admitting: *Deleted

## 2019-03-05 MED ORDER — IBUPROFEN 800 MG PO TABS: 800.0000 mg | ORAL_TABLET | Freq: Three times a day (TID) | ORAL | 0 refills | Status: DC | PRN

## 2019-03-05 NOTE — Lactation Note (Signed)
This note was copied from a baby's chart. Lactation Consultation Note  Patient Name: Marissa Reed FYBOF'B Date: 03/05/2019   Infant is 63 hrs old. Arabic interpreter, Reem, [phone]) was used for consult.  Mom said she had no questions for me about expressing her milk or breastfeeding. I did remind her, however, that she could make more milk if she were to express her milk whenever infant received formula. Mom does have a hand pump for home use. She said she knew how to use it and was familiar with washing pump parts. I encouraged Mom to pump each breast for 15 minutes when formula is given.  Infant was hungry while RN was preparing to do 24-hr feeding screen. I assisted with bottle feeding infant, using the yellow slow-flow nipple. However, I noted that the flow was too fast, so I obtained the extra slow-flow nipple & infant did better. Mom says she has an Avent bottle at home. I recommended that she use the Avent bottle teat that says "newborn."   Initially, parents were interested in [facility], but I reminded them that [facility] would not provide them with halal formula.     Matthias Hughs [facility] 03/05/2019, 10:26 AM

## 2019-03-05 NOTE — Discharge Instructions (Signed)

## 2019-03-06 ENCOUNTER — Encounter: Payer: Medicaid Other | Admitting: Obstetrics and Gynecology

## 2019-03-07 ENCOUNTER — Other Ambulatory Visit (HOSPITAL_COMMUNITY): Payer: Medicaid Other

## 2019-03-09 ENCOUNTER — Inpatient Hospital Stay (HOSPITAL_COMMUNITY)
Admission: AD | Admit: 2019-03-09 | Payer: Medicaid Other | Source: Home / Self Care | Admitting: Obstetrics & Gynecology

## 2019-03-09 ENCOUNTER — Inpatient Hospital Stay (HOSPITAL_COMMUNITY): Payer: Medicaid Other

## 2019-03-21 ENCOUNTER — Ambulatory Visit: Payer: Medicaid Other | Admitting: Obstetrics & Gynecology

## 2019-03-24 ENCOUNTER — Ambulatory Visit (INDEPENDENT_AMBULATORY_CARE_PROVIDER_SITE_OTHER): Payer: Medicaid Other | Admitting: Medical

## 2019-03-24 ENCOUNTER — Other Ambulatory Visit: Payer: Self-pay

## 2019-03-24 ENCOUNTER — Encounter: Payer: Self-pay | Admitting: Medical

## 2019-03-24 NOTE — Progress Notes (Signed)
Pt is here for laceration check. Vaginal delivery on 03/03/19. Pt is breast and bottle feeding. EPDS: score 2

## 2019-03-24 NOTE — Patient Instructions (Signed)

## 2019-03-24 NOTE — Progress Notes (Signed)
History:  [name] is a 23 y.o. G1P1001 who presents to clinic today for check up for 3a perineal laceration during SVD on 03/03/19. The patient reports minimal pain, no discharge, bleeding or fever.    The following portions of the patient's history were reviewed and updated as appropriate: allergies, current medications, family history, past medical history, social history, past surgical history and problem list.  Review of Systems:  Review of Systems  Constitutional: Negative for fever.  Gastrointestinal: Negative for abdominal pain.  Genitourinary:       Neg - vaginal bleeding, discharge, pain at laceration site      Objective:  Physical Exam BP (!) 89/63   Pulse 87   Temp 98.2 F (36.8 C) (Oral)   Wt 224 lb 6.4 oz (101.8 kg)   BMI 34.81 kg/m  Physical Exam  Nursing note and vitals reviewed. Constitutional: She is oriented to person, place, and time. She appears well-developed and well-nourished. No distress.  HENT:  Head: Normocephalic and atraumatic.  Cardiovascular: Normal rate.  Respiratory: Effort normal.  GI: Soft.  Genitourinary:    Genitourinary Comments: Area of previous laceration is healing well. No discharge, significant erythema or edema noted   Neurological: She is alert and oriented to person, place, and time.  Skin: Skin is warm and dry. No erythema.  Psychiatric: She has a normal mood and affect.    Assessment & Plan:  1. Type 3a perineal laceration - Healing well - Warning signs for worsening condition discussed  - Patient already scheduled for routine PP visit on 04/05/19   Luvenia Redden, PA-C 03/24/2019 10:13 AM

## 2019-04-05 ENCOUNTER — Telehealth: Payer: Medicaid Other | Admitting: Obstetrics and Gynecology

## 2019-04-13 ENCOUNTER — Telehealth (INDEPENDENT_AMBULATORY_CARE_PROVIDER_SITE_OTHER): Payer: Medicaid Other | Admitting: Nurse Practitioner

## 2019-04-13 DIAGNOSIS — Z1389 Encounter for screening for other disorder: Secondary | ICD-10-CM

## 2019-04-13 DIAGNOSIS — K5909 Other constipation: Secondary | ICD-10-CM

## 2019-04-13 DIAGNOSIS — K5900 Constipation, unspecified: Secondary | ICD-10-CM | POA: Insufficient documentation

## 2019-04-13 DIAGNOSIS — Z789 Other specified health status: Secondary | ICD-10-CM

## 2019-04-13 DIAGNOSIS — Z3009 Encounter for other general counseling and advice on contraception: Secondary | ICD-10-CM

## 2019-04-13 DIAGNOSIS — R87615 Unsatisfactory cytologic smear of cervix: Secondary | ICD-10-CM

## 2019-04-13 MED ORDER — DOCUSATE CALCIUM 240 MG PO CAPS: 240.0000 mg | ORAL_CAPSULE | Freq: Every day | ORAL | 2 refills | Status: DC

## 2019-04-13 NOTE — Progress Notes (Signed)
TELEHEALTH VIRTUAL POSTPARTUM VISIT ENCOUNTER NOTE Language Barrier - Arabic intpreter on the phone with client.  Client requests phone visit not Mychart or Webex visit. I connected with@ on 04/13/19 at  9:00 AM EST by telephone at home and verified that I am speaking with the correct person using two identifiers.   I discussed the limitations, risks, security and privacy concerns of performing an evaluation and management service by telephone and the availability of in person appointments. I also discussed with the patient that there may be a patient responsible charge related to this service. The patient expressed understanding and agreed to proceed.  Appointment Date: 04/13/2019  OBGYN Clinic: [facility]  Chief Complaint:  Postpartum Visit  History of Present Illness: Marissa Reed is a 24 y.o.  G1P1001, seen for the above chief complaint. Her past medical history is significant for Non English speaking client, Vaginal birth on 03-05-19 at [redacted]w[redacted]d gestation with type 3-A perineal laceration.  Seen on 03-24-19 and laceration healing well.  She is s/p normal spontaneous vaginal delivery on 03-03-19; she was discharged to home on 03-03-19. Client had pitocin and epidural. Baby is doing well with mostly breastfeeding and some formula given as the baby continues to be hungry after feeding..  Complains of Constipation and pain in rectal area with BM.  Denies presence of hemorrhoids.  Vaginal bleeding or discharge: No  Mode of feeding infant: Breast Intercourse: No   Advised to use additional lubrication like KY jelly for intercourse Contraception: condoms PP depression s/s: No .  Any bowel or bladder issues: Yes   Constipation Pap smear: pap smear done in May 2020 was unsatisfactory due to low cellularity and needs to be repeated. Review of Systems: Positive for rectal pain. Her 12 point review of systems is negative or as noted in the History of Present Illness.  Patient Active Problem List   Diagnosis Date Noted  . Constipation 04/13/2019  . Type 3a perineal laceration 03/03/2019  . Supervision of normal pregnancy 08/08/2018  . Language barrier affecting health care 08/08/2018    Medications Kennah Speros had no medications administered during this visit. Current Outpatient Medications  Medication Sig Dispense Refill  . Prenatal Vit w/Fe-Methylfol-FA (PNV PO) Take by mouth.    . docusate calcium (SURFAK) 240 MG capsule Take 1 capsule (240 mg total) by mouth daily. 30 capsule 2  . ibuprofen (ADVIL) 800 MG tablet Take 1 tablet (800 mg total) by mouth every 8 (eight) hours as needed. (Patient not taking: Reported on 03/24/2019) 30 tablet 0   No current facility-administered medications for this visit.    Allergies Patient has no known allergies.  Physical Exam:  General:  Alert, oriented and cooperative.   Mental Status: Normal mood and affect perceived. Normal judgment and thought content.  Rest of physical exam deferred due to type of encounter  PP Depression Screening:  UNABLE TO COMPLETE FULL QUESTIONS - denies mood problems or crying Edinburgh Postnatal Depression Scale - 04/13/19 0909      Edinburgh Postnatal Depression Scale:  In the Past 7 Days   I have been able to laugh and see the funny side of things.  0    I have looked forward with enjoyment to things.  0    I have blamed myself unnecessarily when things went wrong.  0    I have been anxious or worried for no good reason.  0    I have felt scared or panicky for no good reason.  0  Things have been getting on top of me.  0    I have been so unhappy that I have had difficulty sleeping.  0    I have felt sad or miserable.  0    I have been so unhappy that I have been crying.  0    The thought of harming myself has occurred to me.  0    Edinburgh Postnatal Depression Scale Total  0       Assessment:Patient is a 24 y.o. G1P1001 who is 5 weeks postpartum from a normal spontaneous vaginal delivery.  She  is doing well.   Plan:  1. Encounter for postpartum visit NEEDS PAP at next in person visit Has not had intercourse and states no pain near vagina  2. Other constipation Pain with hard BMs.  Advised 64 ounces of water daily.  Advised stool softener and one sent to her pharmacy.  Denies hemorrhoids but states she has pain with BM.  If pain with BM, does not resolve with these measures, she will need an in person visit.  3. Language barrier affecting health care Interpreter present on the entire phone call  4. Birth control counseling Plans to use condoms and advised to always use a condom.  Reviewed that she will ovulate and could become pregnant even before she has a period.   RTC 1 year of sooner if needed for contraception or continued pain in rectal or genital area.  I discussed the assessment and treatment plan with the patient. The patient was provided an opportunity to ask questions and all were answered. The patient agreed with the plan and demonstrated an understanding of the instructions.   The patient was advised to call back or seek an in-person evaluation/go to the ED for any concerning postpartum symptoms.  I provided 19 minutes of non-face-to-face time on the phone during this encounter.   Currie Paris, NP [facility] for [employer], [facility]

## 2019-04-24 ENCOUNTER — Other Ambulatory Visit: Payer: Self-pay | Admitting: Obstetrics and Gynecology

## 2019-04-24 ENCOUNTER — Telehealth: Payer: Self-pay | Admitting: Obstetrics and Gynecology

## 2019-04-24 ENCOUNTER — Ambulatory Visit: Payer: Medicaid Other | Admitting: Obstetrics and Gynecology

## 2019-04-24 MED ORDER — WITCH HAZEL-GLYCERIN EX PADS: 1.0000 "application " | MEDICATED_PAD | CUTANEOUS | 12 refills | Status: DC | PRN

## 2019-04-24 MED ORDER — PHENYLEPH-SHARK LIV OIL-MO-PET 0.25-3-14-71.9 % RE OINT: 1.0000 "application " | TOPICAL_OINTMENT | Freq: Two times a day (BID) | RECTAL | 0 refills | Status: DC | PRN

## 2019-04-24 NOTE — Telephone Encounter (Signed)
Patient did not keep her appointment today for complications following her perineal laceration.    Two Rx's were sent in by Dr. Jolayne Panther for probable hemorrhoids.  Left message for patient regarding prescriptions and to call back if she was still having complications.

## 2019-05-03 ENCOUNTER — Encounter: Payer: Self-pay | Admitting: Obstetrics and Gynecology

## 2019-05-03 ENCOUNTER — Other Ambulatory Visit: Payer: Self-pay

## 2019-05-03 ENCOUNTER — Ambulatory Visit (INDEPENDENT_AMBULATORY_CARE_PROVIDER_SITE_OTHER): Payer: Medicaid Other | Admitting: Obstetrics and Gynecology

## 2019-05-03 VITALS — BP 100/68 | HR 88 | Wt 228.0 lb

## 2019-05-03 DIAGNOSIS — K602 Anal fissure, unspecified: Secondary | ICD-10-CM

## 2019-05-03 DIAGNOSIS — K5909 Other constipation: Secondary | ICD-10-CM | POA: Diagnosis not present

## 2019-05-03 DIAGNOSIS — Z789 Other specified health status: Secondary | ICD-10-CM | POA: Diagnosis not present

## 2019-05-03 NOTE — Progress Notes (Signed)
   GYNECOLOGY OFFICE FOLLOW UP NOTE  History:  24 y.o. G1P1001 here today for follow up for pain with defecation. Has been ongoing since delivery. Havign BM every other day, they are hard. Has improved slightly since started creams and colace but still having pain. She will have pain with BM that lasts until the next morning.   History reviewed. No pertinent past medical history.  Past Surgical History:  Procedure Laterality Date  . TONSILLECTOMY       Current Outpatient Medications:  .  phenylephrine-shark liver oil-mineral oil-petrolatum (PREPARATION H) 0.25-3-14-71.9 % rectal ointment, Place 1 application rectally 2 (two) times daily as needed for hemorrhoids., Disp: 30 g, Rfl: 0 .  Prenatal Vit w/Fe-Methylfol-FA (PNV PO), Take by mouth., Disp: , Rfl:  .  witch hazel-glycerin (TUCKS) pad, Apply 1 application topically as needed for itching., Disp: 40 each, Rfl: 12 .  docusate calcium (SURFAK) 240 MG capsule, Take 1 capsule (240 mg total) by mouth daily., Disp: 30 capsule, Rfl: 2 .  ibuprofen (ADVIL) 800 MG tablet, Take 1 tablet (800 mg total) by mouth every 8 (eight) hours as needed. (Patient not taking: Reported on 03/24/2019), Disp: 30 tablet, Rfl: 0  The following portions of the patient's history were reviewed and updated as appropriate: allergies, current medications, past family history, past medical history, past social history, past surgical history and problem list.   Review of Systems:  Pertinent items noted in HPI and remainder of comprehensive ROS otherwise negative.   Objective:  Physical Exam BP 100/68   Pulse 88   Wt 228 lb (103.4 kg)   BMI 35.37 kg/m  CONSTITUTIONAL: Well-developed, well-nourished female in no acute distress.  HENT:  Normocephalic, atraumatic. External right and left ear normal. Oropharynx is clear and moist EYES: Conjunctivae and EOM are normal. Pupils are equal, round, and reactive to light. No scleral icterus.  NECK: Normal range of motion,  supple, no masses SKIN: Skin is warm and dry. No rash noted. Not diaphoretic. No erythema. No pallor. NEUROLOGIC: Alert and oriented to person, place, and time. Normal reflexes, muscle tone coordination. No cranial nerve deficit noted. PSYCHIATRIC: Normal mood and affect. Normal behavior. Normal judgment and thought content. CARDIOVASCULAR: Normal heart rate noted RESPIRATORY: Effort normal, no problems with respiration noted ABDOMEN: Soft, no distention noted.   PELVIC: Normal appearing external genitalia; two 1 mm anal fissures noted at 12 o'clock on rectum, appear to be healing well, no evidence of infection, no external hemorrhoids noted MUSCULOSKELETAL: Normal range of motion. No edema noted.  Exam done with chaperone present.  Labs and Imaging No results found.  Assessment & Plan:   1. Anal fissure Appears to be healing well Cont cream prn Emphasized need for regular bowel movements to improve healing and prevent from re-tearing or recurring  2. Other constipation Reviewed strategies for improving constipation To start metamucil Increase colace to BID  3. Language barrier Arabic interpretor used   Routine preventative health maintenance measures emphasized. Please refer to After Visit Summary for other counseling recommendations.   Return if symptoms worsen or fail to improve, for no follow up needed at this time.  Total face-to-face time with patient: 22 minutes. Over 50% of encounter was spent on counseling and coordination of care.  Baldemar Lenis, M.D. Attending [facility] [employer] [facility])

## 2019-05-03 NOTE — Patient Instructions (Signed)
Take the stool softener twice per day, even if you think you do not need it. Take Metamucil as directed (one tablespoon in a glass of water) once per day with dinner. If you are able to start having BM daily, then you can keep taking it once daily. If you are not having daily bowel movements, then start taking the Metamucil twice per day. Continue using the creams as needed. Please call if your symptoms are not improving. Continue to drink plenty of water.

## 2019-11-10 ENCOUNTER — Ambulatory Visit (INDEPENDENT_AMBULATORY_CARE_PROVIDER_SITE_OTHER): Payer: 59 | Admitting: Obstetrics and Gynecology

## 2019-11-10 ENCOUNTER — Encounter: Payer: Self-pay | Admitting: Obstetrics and Gynecology

## 2019-11-10 ENCOUNTER — Other Ambulatory Visit: Payer: Self-pay

## 2019-11-10 VITALS — BP 113/73 | HR 74 | Wt 217.0 lb

## 2019-11-10 DIAGNOSIS — N61 Mastitis without abscess: Secondary | ICD-10-CM

## 2019-11-10 DIAGNOSIS — N644 Mastodynia: Secondary | ICD-10-CM | POA: Insufficient documentation

## 2019-11-10 DIAGNOSIS — Z789 Other specified health status: Secondary | ICD-10-CM | POA: Diagnosis not present

## 2019-11-10 MED ORDER — DICLOXACILLIN SODIUM 500 MG PO CAPS: 500.0000 mg | ORAL_CAPSULE | Freq: Four times a day (QID) | ORAL | 0 refills | Status: DC

## 2019-11-10 NOTE — Progress Notes (Signed)
° °  Subjective:    Patient ID: Marissa Reed is a 24 y.o. female presenting with RIGHT BREAST PAIN (X 2 DAYS )  on 11/10/2019  HPI: 24 yo G1P1 seen in [facility] with right nipple and axillary pain x 5 days.  She believes there may be slight redness by the nipple.  She denies fever/chills.  Pt denies any hx of mastitis and has tried no medication to treat the issue.  Of note pt only breast feeds from the right breast due to low supply in the  Left breast.  She denies any sign of oral infection with her 38 month old child.  Review of Systems  Constitutional: Negative for chills and fever.  HENT: Negative.   Eyes: Negative.   Respiratory: Negative.   Cardiovascular: Negative.   Gastrointestinal: Positive for abdominal pain.  Endocrine: Negative.   Genitourinary: Negative.   Musculoskeletal: Negative.   Neurological: Negative.   Hematological: Negative.   Psychiatric/Behavioral: Negative.       Objective:    BP 113/73    Pulse 74    Wt 217 lb (98.4 kg)    BMI 33.66 kg/m  Physical Exam Constitutional:      Appearance: Normal appearance. She is obese.  HENT:     Head: Normocephalic and atraumatic.  Eyes:     Extraocular Movements: Extraocular movements intact.  Cardiovascular:     Rate and Rhythm: Normal rate and regular rhythm.     Heart sounds: Normal heart sounds.  Pulmonary:     Effort: Pulmonary effort is normal.     Breath sounds: Normal breath sounds.  Musculoskeletal:        General: Normal range of motion.  Skin:    Comments: Right breast, ? Mild erythema, but not much difference when compared to left breast. ? Right axillary mass, tender when palpated  Neurological:     Mental Status: She is alert and oriented to person, place, and time.  Psychiatric:        Mood and Affect: Mood normal.        Behavior: Behavior normal.        Judgment: Judgment normal.         Assessment & Plan:   Right breast pain, mastitis At this point, clinically this may be a  mastitis.  Physical exam is not very supportive , but the history of pain only with breastfeeding is suspicious Will treat with dicloxacillin 500 mg po QID x 10 days.   F/u in 7-10 days  Return in about 1 week (around 11/17/2019) for f/u.  Warden Fillers 11/10/2019 10:50 AM

## 2019-11-10 NOTE — Progress Notes (Signed)
Patient reports right breast pain for a couple of days.

## 2019-11-10 NOTE — Patient Instructions (Signed)
Mastitis  Mastitis is inflammation of the breast tissue. It occurs most often in women who are breastfeeding, but it can also affect other women, and sometimes even men. What are the causes? This condition is usually caused by a bacterial infection. Bacteria enter the breast tissue through cuts or openings in the skin. Typically, this occurs with breastfeeding because of cracked or irritated nipples. Sometimes, it can occur when there is no opening in the skin. This is usually caused by plugged milk ducts. Other causes include:  Nipple piercing.  Some forms of breast cancer. What are the signs or symptoms? Symptoms of this condition include:  Swelling, redness, tenderness, and pain in an area of the breast. The area may also feel warm to the touch. These symptoms usually affect the upper part of the breast, toward the armpit region.  Swelling of the glands under the arm on the same side.  Fever.  Rapid pulse.  Fatigue, headache, and flu-like muscle aches. If an infection is allowed to progress, a collection of pus (abscess) may develop. How is this diagnosed? This condition can usually be diagnosed based on a physical exam and your symptoms. You may also have other tests, such as:  Blood tests to determine if your body is fighting a bacterial infection.  Mammogram or ultrasound tests to rule out other problems or diseases.  Testing of pus and other fluids. Pus from the breast may be collected and examined in the lab. If an abscess has developed, the fluid in the abscess can be removed with a needle. This test can be used to confirm the diagnosis and identify the bacteria present.  If you are breastfeeding, breast milk may be cultured and tested for bacteria. How is this treated? Treatment for this condition may include:  Applying heat or cold compresses to the affected area.  Medicine for pain.  Antibiotic medicine to treat a bacterial infection. This is usually taken by  mouth.  Self-care such as rest and increased fluid intake.  If an abscess has developed, it may be treated by removing fluid with a needle. Mastitis that occurs with breastfeeding will sometimes go away on its own, so your health care provider may choose to wait 24 hours after first seeing you to decide whether a prescription medicine is needed. You may be told of different ways to help manage breastfeeding, such as continuing to breastfeed or pump in order to ensure adequate milk flow. Follow these instructions at home: Medicines  Take over-the-counter and prescription medicines only as told by your health care provider.  If you were prescribed an antibiotic medicine, take it as told by your health care provider. Do not stop taking the antibiotic even if you start to feel better. General instructions  Do not wear a tight or underwire bra. Wear a soft, supportive bra.  Increase your fluid intake, especially if you have a fever.  Get plenty of rest. If you are breastfeeding:  Continue to empty your breasts as often as possible either by breastfeeding or by using a breast pump. This will decrease the pressure and the pain that comes with it. Ask your health care provider if changes need to be made to your breastfeeding or pumping routine.  Keep your nipples clean and dry.  During breastfeeding, empty the first breast completely before going to the other breast. If your baby is not emptying your breasts completely, use a breast pump to empty your breasts.  Use breast massage during feeding or pumping sessions.    If directed, apply moist heat to the affected area of your breast right before breastfeeding or pumping. Use the heat source that your health care provider recommends.  If directed, put ice on the affected area of your breast right after breastfeeding or pumping: ? Put ice in a plastic bag. ? Place a towel between your skin and the bag. ? Leave the ice on for 20 minutes.  If  you go back to work, pump your breasts while at work to stay within your nursing schedule.  Avoid allowing your breasts to become overly filled with milk (engorged). Contact a health care provider if:  You have pus-like discharge from the breast.  You have a fever.  Your symptoms do not improve within 2 days of starting treatment. Get help right away if:  Your pain and swelling are getting worse.  You have pain that is not controlled with medicine.  You have a red line extending from the breast toward your armpit. Summary  Mastitis is inflammation of the breast tissue. It occurs most often in women who are breastfeeding, but it can also affect non-breastfeeding women and some men.  This condition is usually caused by a bacterial infection.  This condition may be treated with hot and cold compresses, medicines, self-care, and certain breastfeeding strategies.  If you were prescribed an antibiotic medicine, take it as told by your health care provider. Do not stop taking the antibiotic even if you start to feel better. This information is not intended to replace advice given to you by your health care provider. Make sure you discuss any questions you have with your health care provider. Document Revised: 03/05/2017 Document Reviewed: 04/14/2016 Elsevier Patient Education  2020 Elsevier Inc.  

## 2019-11-22 ENCOUNTER — Ambulatory Visit: Payer: Medicaid Other | Admitting: Obstetrics and Gynecology

## 2020-01-24 ENCOUNTER — Encounter: Payer: Self-pay | Admitting: Medical

## 2020-04-06 NOTE — L&D Delivery Note (Signed)
Vaginal Delivery Note  Spontaneous delivery of live viable female infant from the LOA position through an intact perineum. Delivery of anterior right shoulder with gentle downward guidance followed by delivery of the left posterior shoulder with gentle upward guidance. Body followed spontaneously. Infant placed on maternal chest. Nursery present and helped with neonatal resuscitation and evaluation. Cord clamped and cut after one minute. Cord blood collected. Placenta delivered spontaneously and intact with a 3 vessel cord.  2nd degree perineal laceration. Uterus firm and below umbilicus at the end of the delivery.  Mom and baby recovering in stable condition. Sponge and needle counts were correct at the end of the delivery.  APGARS:  1 minute:7  5 minutes: 5  10 minutes: 8  Weight: 2290 grams  Adelene Idler MD [facility], [facility] 01/24/21 3:47 AM

## 2020-07-05 ENCOUNTER — Other Ambulatory Visit: Payer: Self-pay

## 2020-07-05 ENCOUNTER — Encounter (HOSPITAL_COMMUNITY): Payer: Self-pay

## 2020-07-05 ENCOUNTER — Ambulatory Visit (HOSPITAL_COMMUNITY)
Admission: EM | Admit: 2020-07-05 | Discharge: 2020-07-05 | Disposition: A | Discharge status: Home / Self Care | Payer: 59 | Attending: Emergency Medicine | Admitting: Emergency Medicine

## 2020-07-05 DIAGNOSIS — Z3201 Encounter for pregnancy test, result positive: Secondary | ICD-10-CM | POA: Diagnosis not present

## 2020-07-05 LAB — POC URINE PREG, ED: Preg Test, Ur: POSITIVE — AB

## 2020-07-05 MED ORDER — PRENATAL COMPLETE 14-0.4 MG PO TABS: 1.0000 | ORAL_TABLET | Freq: Every day | ORAL | 0 refills | Status: DC

## 2020-07-05 NOTE — ED Provider Notes (Signed)
[facility]    CSN: [id number] Arrival date & time: 07/05/20  1034      History   Chief Complaint Chief Complaint  Patient presents with  . Possible Pregnancy    HPI Marissa Reed is a 25 y.o. female.   Patient is here with complaint of missed periods.  Reports last period was in Feb.  Denies any vaginal pain or spotting.  Reports having some mild cramping, but denies any today.  Reports having nausea but no vomiting.  Denies any specific alleviating or aggravating factors.  Denies any fevers, chest pain, shortness of breath, N/V/D, numbness, tingling, weakness, abdominal pain, or headaches.   ROS: As per HPI, all other pertinent ROS negative   The history is provided by the patient.    History reviewed. No pertinent past medical history.  Patient Active Problem List   Diagnosis Date Noted  . Breast pain in female 11/10/2019  . Mastitis, right, acute 11/10/2019  . Constipation 04/13/2019  . Pap smear of cervix unsatisfactory 04/13/2019  . Type 3a perineal laceration 03/03/2019  . Supervision of normal pregnancy 08/08/2018  . Language barrier affecting health care 08/08/2018    Past Surgical History:  Procedure Laterality Date  . TONSILLECTOMY      OB History    Gravida  1   Para  1   Term  1   Preterm      AB      Living  1     SAB      IAB      Ectopic      Multiple  0   Live Births  1            Home Medications    Prior to Admission medications   Medication Sig Start Date End Date Taking? Authorizing Provider  folic acid (FOLVITE) 800 MCG tablet Take 400 mcg by mouth daily.   Yes [provider]  Prenatal Vit-Fe Fumarate-FA (PRENATAL COMPLETE) 14-0.4 MG TABS Take 1 tablet by mouth daily. 07/05/20  Yes Ivette Loyal, NP  dicloxacillin (DYNAPEN) 500 MG capsule Take 1 capsule (500 mg total) by mouth 4 (four) times daily. 11/10/19   Warden Fillers, MD  docusate calcium (SURFAK) 240 MG capsule Take 1 capsule (240 mg  total) by mouth daily. Patient not taking: No sig reported 04/13/19   Currie Paris, NP  ibuprofen (ADVIL) 800 MG tablet Take 1 tablet (800 mg total) by mouth every 8 (eight) hours as needed. Patient not taking: No sig reported 03/05/19   Arabella Merles, CNM  phenylephrine-shark liver oil-mineral oil-petrolatum (PREPARATION H) 0.25-3-14-71.9 % rectal ointment Place 1 application rectally 2 (two) times daily as needed for hemorrhoids. Patient not taking: No sig reported 04/24/19   Constant, Peggy, MD  Prenatal Vit w/Fe-Methylfol-FA (PNV PO) Take by mouth. Patient not taking: No sig reported    [provider]  witch hazel-glycerin (TUCKS) pad Apply 1 application topically as needed for itching. Patient not taking: No sig reported 04/24/19   Constant, Peggy, MD    Family History Family History  Problem Relation Age of Onset  . Diabetes Mother   . Diabetes Father     Social History Social History   Tobacco Use  . Smoking status: Never Smoker  . Smokeless tobacco: Never Used  Vaping Use  . Vaping Use: Never used  Substance Use Topics  . Alcohol use: Never  . Drug use: Never     Allergies  Patient has no known allergies.   Review of Systems Review of Systems  Gastrointestinal: Positive for nausea.     Physical Exam Triage Vital Signs ED Triage Vitals  Enc Vitals Group     BP 07/05/20 1056 95/67     Pulse Rate 07/05/20 1056 97     Resp 07/05/20 1056 18     Temp 07/05/20 1056 98.6 F (37 C)     Temp Source 07/05/20 1056 Oral     SpO2 07/05/20 1056 99 %     Weight --      Height --      Head Circumference --      Peak Flow --      Pain Score 07/05/20 1059 1     Pain Loc --      Pain Edu? --      Excl. in GC? --    No data found.  Updated Vital Signs BP 95/67 (BP Location: Right Arm)   Pulse 97   Temp 98.6 F (37 C) (Oral)   Resp 18   LMP 05/25/2020 (Exact Date)   SpO2 99%   Breastfeeding No   Visual Acuity Right Eye Distance:   Left Eye  Distance:   Bilateral Distance:    Right Eye Near:   Left Eye Near:    Bilateral Near:     Physical Exam Vitals and nursing note reviewed.  Constitutional:      General: She is not in acute distress.    Appearance: Normal appearance. She is not ill-appearing, toxic-appearing or diaphoretic.  HENT:     Head: Normocephalic and atraumatic.  Eyes:     Conjunctiva/sclera: Conjunctivae normal.  Cardiovascular:     Rate and Rhythm: Normal rate.     Pulses: Normal pulses.  Pulmonary:     Effort: Pulmonary effort is normal.  Abdominal:     General: Abdomen is flat.  Musculoskeletal:        General: Normal range of motion.     Cervical back: Normal range of motion.  Skin:    General: Skin is warm and dry.  Neurological:     General: No focal deficit present.     Mental Status: She is alert and oriented to person, place, and time.  Psychiatric:        Mood and Affect: Mood normal.      UC Treatments / Results  Labs (all labs ordered are listed, but only abnormal results are displayed) Labs Reviewed - No data to display  EKG   Radiology No results found.  Procedures Procedures (including critical care time)  Medications Ordered in UC Medications - No data to display  Initial Impression / Assessment and Plan / UC Course  I have reviewed the triage vital signs and the nursing notes.  Pertinent labs & imaging results that were available during my care of the patient were reviewed by me and considered in my medical decision making (see chart for details).     Positive pregnancy test POC urine pregnancy test was positive.  Assessment negative for red flags or concerns.  Prenatal vitamins qd prescribed.  Information on medications safe to take during pregnancy, especially for nausea, discussed and form given to patient.  Follow up with OB/GYN around 8 weeks.   Final Clinical Impressions(s) / UC Diagnoses   Final diagnoses:  Positive pregnancy test  Pregnancy test  positive     Discharge Instructions     Your pregnacy test is positive.  Follow up  with the [facility] for prenatal care.   Follow up with your Primary Care Provider or go to the Emergency Department if symptoms worsen or do not improve in the next few days.        ED Prescriptions    Medication Sig Dispense Auth. Provider   Prenatal Vit-Fe Fumarate-FA (PRENATAL COMPLETE) 14-0.4 MG TABS Take 1 tablet by mouth daily. 60 tablet Ivette Loyal, NP     PDMP not reviewed this encounter.   Ivette Loyal, NP 07/05/20 1150

## 2020-07-05 NOTE — Discharge Instructions (Addendum)
Your pregnacy test is positive.  Follow up with the [facility] for prenatal care.   Follow up with your Primary Care Provider or go to the Emergency Department if symptoms worsen or do not improve in the next few days.

## 2020-07-05 NOTE — ED Triage Notes (Signed)
Pt presents for pregnancy tests "I feel like I'm pregnant". Reports having colic and nausea for 5 days. Pt requested prescriptions for nausea.

## 2020-07-08 ENCOUNTER — Other Ambulatory Visit: Payer: Self-pay

## 2020-07-16 ENCOUNTER — Other Ambulatory Visit: Payer: Self-pay

## 2020-07-16 MED ORDER — PROMETHAZINE HCL 25 MG PO TABS: 25.0000 mg | ORAL_TABLET | Freq: Four times a day (QID) | ORAL | 0 refills | Status: DC | PRN

## 2020-07-16 NOTE — Telephone Encounter (Signed)
Patient is requesting medication for nausea. She is early pregnant and having a hard time keeping food down.

## 2020-08-01 ENCOUNTER — Ambulatory Visit (INDEPENDENT_AMBULATORY_CARE_PROVIDER_SITE_OTHER): Payer: 59

## 2020-08-01 ENCOUNTER — Other Ambulatory Visit: Payer: Self-pay

## 2020-08-01 VITALS — BP 101/71 | HR 109 | Ht 67.32 in | Wt 221.3 lb

## 2020-08-01 DIAGNOSIS — Z3481 Encounter for supervision of other normal pregnancy, first trimester: Secondary | ICD-10-CM

## 2020-08-01 DIAGNOSIS — Z3491 Encounter for supervision of normal pregnancy, unspecified, first trimester: Secondary | ICD-10-CM | POA: Insufficient documentation

## 2020-08-01 DIAGNOSIS — O3680X0 Pregnancy with inconclusive fetal viability, not applicable or unspecified: Secondary | ICD-10-CM | POA: Diagnosis not present

## 2020-08-01 DIAGNOSIS — Z3A09 9 weeks gestation of pregnancy: Secondary | ICD-10-CM | POA: Diagnosis not present

## 2020-08-01 DIAGNOSIS — Z3490 Encounter for supervision of normal pregnancy, unspecified, unspecified trimester: Secondary | ICD-10-CM | POA: Insufficient documentation

## 2020-08-01 DIAGNOSIS — O219 Vomiting of pregnancy, unspecified: Secondary | ICD-10-CM

## 2020-08-01 MED ORDER — PROMETHAZINE HCL 25 MG PO TABS: 25.0000 mg | ORAL_TABLET | Freq: Four times a day (QID) | ORAL | 2 refills | Status: DC | PRN

## 2020-08-01 NOTE — Progress Notes (Signed)
PRENATAL INTAKE SUMMARY  Marissa Reed presents today New OB Nurse Interview.  OB History    Gravida  2   Para  1   Term  1   Preterm      AB      Living  1     SAB      IAB      Ectopic      Multiple  0   Live Births  1          I have reviewed the patient's medical, obstetrical, social, and family histories, medications, and available lab results.  SUBJECTIVE She has no unusual complaints  OBJECTIVE Initial Physical Exam (New OB)  GENERAL APPEARANCE: alert, well appearing   ASSESSMENT Normal pregnancy  PLAN Prenatal care to be completed at [facility] New OB labs to be completed at [facility] Provider Visit Baby Scripts ordered Patient has BP cuff at home U/S performed today reveals single live IUP at [redacted]w[redacted]d. FHR 178. Patient declined transvaginal exam. Discussed with patient that measurements would be more accurate with transvaginal. She understood. Proceeded with transabdominal u/s. PHQ 9 score: 3 GAD 7 score: 2 Phenergan sent to pharmacy for nausea Pregnancy verification letter provided.

## 2020-08-05 NOTE — Progress Notes (Signed)
Patient was assessed and managed by nursing staff during this encounter. I have reviewed the chart and agree with the documentation and plan. I have also made any necessary editorial changes.  Catalina Antigua, MD 08/05/2020 12:23 PM

## 2020-08-08 ENCOUNTER — Ambulatory Visit (INDEPENDENT_AMBULATORY_CARE_PROVIDER_SITE_OTHER): Payer: 59 | Admitting: Nurse Practitioner

## 2020-08-08 ENCOUNTER — Encounter: Payer: Self-pay | Admitting: Nurse Practitioner

## 2020-08-08 ENCOUNTER — Other Ambulatory Visit: Payer: Self-pay

## 2020-08-08 ENCOUNTER — Other Ambulatory Visit (HOSPITAL_COMMUNITY)
Admission: RE | Admit: 2020-08-08 | Discharge: 2020-08-08 | Disposition: A | Discharge status: Home / Self Care | Payer: 59 | Source: Ambulatory Visit | Attending: Nurse Practitioner | Admitting: Nurse Practitioner

## 2020-08-08 VITALS — BP 107/73 | HR 101 | Wt 222.0 lb

## 2020-08-08 DIAGNOSIS — Z3A10 10 weeks gestation of pregnancy: Secondary | ICD-10-CM

## 2020-08-08 DIAGNOSIS — G44209 Tension-type headache, unspecified, not intractable: Secondary | ICD-10-CM | POA: Insufficient documentation

## 2020-08-08 DIAGNOSIS — R8271 Bacteriuria: Secondary | ICD-10-CM

## 2020-08-08 DIAGNOSIS — Z3481 Encounter for supervision of other normal pregnancy, first trimester: Secondary | ICD-10-CM | POA: Diagnosis present

## 2020-08-08 DIAGNOSIS — Z6834 Body mass index (BMI) 34.0-34.9, adult: Secondary | ICD-10-CM | POA: Insufficient documentation

## 2020-08-08 DIAGNOSIS — Z789 Other specified health status: Secondary | ICD-10-CM

## 2020-08-08 NOTE — Progress Notes (Signed)
Subjective:   Marissa Reed is a 25 y.o. G2P1001 at [redacted]w[redacted]d by LMP being seen today for her first obstetrical visit.  Her obstetrical history is significant for obesity. Patient does intend to breast feed. Pregnancy history fully reviewed.  Patient reports no complaints.  HISTORY: OB History  Gravida Para Term Preterm AB Living  2 1 1  0 0 1  SAB IAB Ectopic Multiple Live Births  0 0 0 0 1    # Outcome Date GA Lbr Len/2nd Weight Sex Delivery Anes PTL Lv  2 Current           1 Term 03/03/19 [redacted]w[redacted]d 03:11 / 00:53 7 lb 13 oz (3.545 kg) M Vag-Spont EPI  LIV     Name: Kracke,BOY Dianelys     Apgar1: 8  Apgar5: 7   History reviewed. No pertinent past medical history. Past Surgical History:  Procedure Laterality Date  . TONSILLECTOMY     Family History  Problem Relation Age of Onset  . Diabetes Mother   . Diabetes Father    Social History   Tobacco Use  . Smoking status: Never Smoker  . Smokeless tobacco: Never Used  Vaping Use  . Vaping Use: Never used  Substance Use Topics  . Alcohol use: Never  . Drug use: Never   No Known Allergies Current Outpatient Medications on File Prior to Visit  Medication Sig Dispense Refill  . Prenatal Vit-Fe Fumarate-FA (PRENATAL COMPLETE) 14-0.4 MG TABS Take 1 tablet by mouth daily. 60 tablet 0  . promethazine (PHENERGAN) 25 MG tablet Take 1 tablet (25 mg total) by mouth every 6 (six) hours as needed for nausea or vomiting. 30 tablet 2  . Prenatal Vit w/Fe-Methylfol-FA (PNV PO) Take by mouth.    . Prenatal Vit-Fe Fumarate-FA (M-NATAL PLUS) 27-1 MG TABS Take 1 tablet by mouth daily.    . promethazine (PHENERGAN) 25 MG tablet Take 1 tablet (25 mg total) by mouth every 6 (six) hours as needed for nausea or vomiting. 30 tablet 0   No current facility-administered medications on file prior to visit.     Exam   Vitals:   08/08/20 1022  BP: 107/73  Pulse: (!) 101  Weight: 222 lb (100.7 kg)   Fetal Heart Rate (bpm): 171  Uterus:      Pelvic Exam: Perineum: no hemorrhoids, normal perineum   Vulva: normal external genitalia, no lesions   Vagina:  normal mucosa, normal discharge   Cervix: no lesions and normal, pap smear done.    Adnexa: normal adnexa and no mass, fullness, tenderness   Bony Pelvis: average  System: General: well-developed, well-nourished female in no acute distress   Breast:  normal appearance, no masses or tenderness   Skin: normal coloration and turgor, no rashes   Neurologic: oriented, normal, negative, normal mood   Extremities: normal strength, tone, and muscle mass, ROM of all joints is normal   HEENT extraocular movement intact and sclera clear, anicteric   Mouth/Teeth Deferred.     Neck supple and no masses, normal thyroid   Cardiovascular: regular rate and rhythm   Respiratory:  no respiratory distress, normal breath sounds   Abdomen: soft, non-tender; no masses,  no organomegaly     Assessment:   Pregnancy: G2P1001 Patient Active Problem List   Diagnosis Date Noted  . Encounter for supervision of normal pregnancy in first trimester 08/01/2020  . Breast pain in female 11/10/2019  . Mastitis, right, acute 11/10/2019  . Constipation 04/13/2019  .  Pap smear of cervix unsatisfactory 04/13/2019  . Type 3a perineal laceration 03/03/2019  . Supervision of normal pregnancy 08/08/2018  . Language barrier affecting health care 08/08/2018     Plan:  1. Encounter for supervision of other normal pregnancy in first trimester Planning a trip to [place] in July for 56 weeks and will purchase plane tickets today.  Wanted provider to tell her if it would be a problem for the baby for her to travel.  Reviewed risks of travel including risk of DVT and possible preterm labor.  Advised client to make the decision for whether she wants to travel at that time. Very timid with pelvic exam but has had an incomplete pap result since 2020.  Repeated today and tolerated well although client was very tense with  exam. Advised to see dentist every 6 months for dental cleaning. Advised brushing twice daily with fluoride toothpaste and flossing daily despite some bleeding noted.  To notify the office if the bleeding is worsening.  - Obstetric Panel, Including HIV - Hepatitis C antibody - Genetic Screening - Cervicovaginal ancillary only - Cytology - PAP( [place]) - [place] MFM OB COMP + 14 WK; Future - Culture, OB Urine  2. Language barrier affecting health care In person interpreter present for the entire visit  Initial labs drawn. Continue prenatal vitamins. Genetic Screening discussed, NIPS: ordered. Ultrasound discussed; fetal anatomic survey: ordered. Problem list reviewed and updated. The nature of [facility] with multiple MDs and other Advanced Practice Providers was explained to patient; also emphasized that residents, students are part of our team. Routine obstetric precautions reviewed. Return in about 4 weeks (around 09/05/2020) for in person ROB.  Total face-to-face time with patient: 40 minutes.  Over 50% of encounter was spent on counseling and coordination of care.     Nolene Bernheim, FNP Family Nurse Practitioner, [facility] for [employer], [facility] 08/08/2020 3:36 PM

## 2020-08-08 NOTE — Progress Notes (Signed)
Pt presents for NOB. NOB intake completed on 08/01/20. Pt has questions about traveling overseas. She is planning a trip to [place] in July.

## 2020-08-09 LAB — OBSTETRIC PANEL, INCLUDING HIV
Antibody Screen: NEGATIVE
Basophils Absolute: 0 10*3/uL (ref 0.0–0.2)
Basos: 0 %
EOS (ABSOLUTE): 0 10*3/uL (ref 0.0–0.4)
Eos: 1 %
HIV Screen 4th Generation wRfx: NONREACTIVE
Hematocrit: 38 % (ref 34.0–46.6)
Hemoglobin: 12.8 g/dL (ref 11.1–15.9)
Hepatitis B Surface Ag: NEGATIVE
Immature Grans (Abs): 0 10*3/uL (ref 0.0–0.1)
Immature Granulocytes: 0 %
Lymphocytes Absolute: 1.8 10*3/uL (ref 0.7–3.1)
Lymphs: 32 %
MCH: 29.8 pg (ref 26.6–33.0)
MCHC: 33.7 g/dL (ref 31.5–35.7)
MCV: 88 fL (ref 79–97)
Monocytes Absolute: 0.5 10*3/uL (ref 0.1–0.9)
Monocytes: 8 %
Neutrophils Absolute: 3.3 10*3/uL (ref 1.4–7.0)
Neutrophils: 59 %
Platelets: 329 10*3/uL (ref 150–450)
RBC: 4.3 x10E6/uL (ref 3.77–5.28)
RDW: 13.8 % (ref 11.7–15.4)
RPR Ser Ql: NONREACTIVE
Rh Factor: POSITIVE
Rubella Antibodies, IGG: 2.6 index (ref >0.99)
WBC: 5.7 10*3/uL (ref 3.4–10.8)

## 2020-08-09 LAB — CERVICOVAGINAL ANCILLARY ONLY
Bacterial Vaginitis (gardnerella): NEGATIVE
Candida Glabrata: NEGATIVE
Candida Vaginitis: NEGATIVE
Chlamydia: NEGATIVE
Comment: NEGATIVE
Comment: NEGATIVE
Comment: NEGATIVE
Comment: NEGATIVE
Comment: NEGATIVE
Comment: NORMAL
Neisseria Gonorrhea: NEGATIVE
Trichomonas: NEGATIVE

## 2020-08-09 LAB — CYTOLOGY - PAP: Diagnosis: NEGATIVE

## 2020-08-09 LAB — HEPATITIS C ANTIBODY: Hep C Virus Ab: <0.1 s/co ratio (ref 0.0–0.9)

## 2020-08-12 ENCOUNTER — Encounter: Payer: Self-pay | Admitting: Advanced Practice Midwife

## 2020-08-13 ENCOUNTER — Encounter: Payer: Self-pay | Admitting: Nurse Practitioner

## 2020-08-13 LAB — URINE CULTURE, OB REFLEX

## 2020-08-13 LAB — CULTURE, OB URINE

## 2020-08-14 ENCOUNTER — Encounter: Payer: Self-pay | Admitting: Nurse Practitioner

## 2020-08-14 DIAGNOSIS — R8271 Bacteriuria: Secondary | ICD-10-CM | POA: Insufficient documentation

## 2020-08-14 HISTORY — DX: Bacteriuria: R82.71

## 2020-08-14 MED ORDER — AMOXICILLIN 500 MG PO TABS: 500.0000 mg | ORAL_TABLET | Freq: Three times a day (TID) | ORAL | 0 refills | Status: AC

## 2020-08-14 NOTE — Addendum Note (Signed)
Addended by: Currie Paris on: 08/14/2020 07:51 AM   Modules accepted: Orders

## 2020-08-16 ENCOUNTER — Other Ambulatory Visit: Payer: Self-pay

## 2020-08-16 DIAGNOSIS — O219 Vomiting of pregnancy, unspecified: Secondary | ICD-10-CM

## 2020-08-16 MED ORDER — PROMETHAZINE HCL 25 MG PO TABS: 25.0000 mg | ORAL_TABLET | Freq: Four times a day (QID) | ORAL | 1 refills | Status: DC | PRN

## 2020-09-05 ENCOUNTER — Other Ambulatory Visit: Payer: Self-pay

## 2020-09-05 ENCOUNTER — Ambulatory Visit (INDEPENDENT_AMBULATORY_CARE_PROVIDER_SITE_OTHER): Payer: 59 | Admitting: Advanced Practice Midwife

## 2020-09-05 VITALS — BP 112/78 | HR 109 | Wt 222.8 lb

## 2020-09-05 DIAGNOSIS — Z3481 Encounter for supervision of other normal pregnancy, first trimester: Secondary | ICD-10-CM

## 2020-09-05 DIAGNOSIS — O219 Vomiting of pregnancy, unspecified: Secondary | ICD-10-CM

## 2020-09-05 DIAGNOSIS — Z789 Other specified health status: Secondary | ICD-10-CM

## 2020-09-05 DIAGNOSIS — Z3A14 14 weeks gestation of pregnancy: Secondary | ICD-10-CM

## 2020-09-05 MED ORDER — PROMETHAZINE HCL 25 MG PO TABS: 25.0000 mg | ORAL_TABLET | Freq: Four times a day (QID) | ORAL | 1 refills | Status: DC | PRN

## 2020-09-05 MED ORDER — PRENATAL COMPLETE 14-0.4 MG PO TABS: 1.0000 | ORAL_TABLET | Freq: Every day | ORAL | 3 refills | Status: DC

## 2020-09-05 NOTE — Progress Notes (Signed)
Pt denies any pain, and is not feeling movement yet.

## 2020-09-05 NOTE — Progress Notes (Signed)
   PRENATAL VISIT NOTE  Subjective:  Marissa Reed is a 25 y.o. G2P1001 at 101w5d being seen today for ongoing prenatal care.  She is currently monitored for the following issues for this low-risk pregnancy and has Language barrier affecting health care; Type 3a perineal laceration; Constipation; Breast pain in female; Encounter for supervision of normal pregnancy in first trimester; BMI 34.0-34.9,adult; Tension-type headache; and Group B streptococcal bacteriuria on their problem list.  Patient reports no complaints.  Contractions: Not present. Vag. Bleeding: None.   . Denies leaking of fluid.   The following portions of the patient's history were reviewed and updated as appropriate: allergies, current medications, past family history, past medical history, past social history, past surgical history and problem list.   Objective:   Vitals:   09/05/20 1010  BP: 112/78  Pulse: (!) 109  Weight: 222 lb 12.8 oz (101.1 kg)    Fetal Status: Fetal Heart Rate (bpm): 150         General:  Alert, oriented and cooperative. Patient is in no acute distress.  Skin: Skin is warm and dry. No rash noted.   Cardiovascular: Normal heart rate noted  Respiratory: Normal respiratory effort, no problems with respiration noted  Abdomen: Soft, gravid, appropriate for gestational age.  Pain/Pressure: Absent     Pelvic: Cervical exam deferred        Extremities: Normal range of motion.  Edema: None  Mental Status: Normal mood and affect. Normal behavior. Normal judgment and thought content.   Assessment and Plan:  Pregnancy: G2P1001 at [redacted]w[redacted]d 1. Encounter for supervision of other normal pregnancy in first trimester --Anticipatory guidance about next visits/weeks of pregnancy given. --Pt plans to travel to [place] in ~ 4 weeks, will be out of the country for 5 weeks.   --Rx for PNV and antiemetic medication for 90 day supply, pt to notify office if problems with insurance. --Next appt before pt leaves, pt prefers  appt with me if possible --[place] scheduled 6/27   2. Language barrier affecting health care --Arabic interpreter used for all communication  3. [redacted] weeks gestation of pregnancy   Preterm labor symptoms and general obstetric precautions including but not limited to vaginal bleeding, contractions, leaking of fluid and fetal movement were reviewed in detail with the patient. Please refer to After Visit Summary for other counseling recommendations.   Return in about 4 weeks (around 10/03/2020).  Future Appointments  Date Time Provider Department Center  09/27/2020 10:55 AM Rasch, Harolyn Rutherford, NP [facility] None  09/30/2020  1:00 PM [facility]    Sharen Counter, CNM

## 2020-09-05 NOTE — Patient Instructions (Signed)

## 2020-09-27 ENCOUNTER — Ambulatory Visit (INDEPENDENT_AMBULATORY_CARE_PROVIDER_SITE_OTHER): Payer: 59 | Admitting: Obstetrics and Gynecology

## 2020-09-27 ENCOUNTER — Other Ambulatory Visit: Payer: Self-pay

## 2020-09-27 VITALS — BP 90/60 | HR 94 | Wt 225.0 lb

## 2020-09-27 DIAGNOSIS — Z3481 Encounter for supervision of other normal pregnancy, first trimester: Secondary | ICD-10-CM

## 2020-09-27 NOTE — Progress Notes (Signed)
Pt states had episode of dizziness and lightheaded last week.

## 2020-09-27 NOTE — Progress Notes (Signed)
   PRENATAL VISIT NOTE  Subjective:  Marissa Reed is a 25 y.o. G2P1001 at [redacted]w[redacted]d being seen today for ongoing prenatal care.  She is currently monitored for the following issues for this low-risk pregnancy and has Language barrier affecting health care; Type 3a perineal laceration; Constipation; Breast pain in female; Encounter for supervision of normal pregnancy in first trimester; BMI 34.0-34.9,adult; Tension-type headache; and Group B streptococcal bacteriuria on their problem list.  Patient reports  episode of dizziness last week .  Contractions: Not present. Vag. Bleeding: None.  Movement: Present. Denies leaking of fluid.   The following portions of the patient's history were reviewed and updated as appropriate: allergies, current medications, past family history, past medical history, past social history, past surgical history and problem list.   Objective:   Vitals:   09/27/20 1103  BP: 90/60  Pulse: 94  Weight: 225 lb (102.1 kg)    Fetal Status: Fetal Heart Rate (bpm): 145   Movement: Present     General:  Alert, oriented and cooperative. Patient is in no acute distress.  Skin: Skin is warm and dry. No rash noted.   Cardiovascular: Normal heart rate noted  Respiratory: Normal respiratory effort, no problems with respiration noted  Abdomen: Soft, gravid, appropriate for gestational age.  Pain/Pressure: Absent     Pelvic: Cervical exam deferred        Extremities: Normal range of motion.     Mental Status: Normal mood and affect. Normal behavior. Normal judgment and thought content.   Assessment and Plan:  Pregnancy: G2P1001 at [redacted]w[redacted]d  1. Encounter for supervision of other normal pregnancy in first trimester  - AFP, Serum, Open Spina Bifida  - increase oral fluid intake especially when it is hot. Protein snacks frequently.  - She is going [place] seas until Aug 9.   Preterm labor symptoms and general obstetric precautions including but not limited to vaginal bleeding,  contractions, leaking of fluid and fetal movement were reviewed in detail with the patient. Please refer to After Visit Summary for other counseling recommendations.   No follow-ups on file.  Future Appointments  Date Time Provider Department Center  09/30/2020  1:00 PM [facility] US1 [facility]    Venia Carbon, NP

## 2020-09-29 LAB — AFP, SERUM, OPEN SPINA BIFIDA
AFP MoM: 1.36
AFP Value: 42.3 ng/mL
Gest. Age on Collection Date: 17 weeks
Maternal Age At EDD: 23.6 yr
OSBR Risk 1 IN: 4034
Test Results:: NEGATIVE
Weight: 225 [lb_av]

## 2020-09-30 ENCOUNTER — Other Ambulatory Visit: Payer: Self-pay | Admitting: *Deleted

## 2020-09-30 ENCOUNTER — Other Ambulatory Visit: Payer: Self-pay | Admitting: Nurse Practitioner

## 2020-09-30 ENCOUNTER — Ambulatory Visit: Payer: 59 | Attending: Nurse Practitioner

## 2020-09-30 ENCOUNTER — Other Ambulatory Visit: Payer: Self-pay

## 2020-09-30 DIAGNOSIS — Z363 Encounter for antenatal screening for malformations: Secondary | ICD-10-CM

## 2020-09-30 DIAGNOSIS — Z3689 Encounter for other specified antenatal screening: Secondary | ICD-10-CM

## 2020-09-30 DIAGNOSIS — O99212 Obesity complicating pregnancy, second trimester: Secondary | ICD-10-CM | POA: Diagnosis not present

## 2020-09-30 DIAGNOSIS — Z3A17 17 weeks gestation of pregnancy: Secondary | ICD-10-CM

## 2020-09-30 DIAGNOSIS — O350XX0 Maternal care for (suspected) central nervous system malformation in fetus, not applicable or unspecified: Secondary | ICD-10-CM

## 2020-09-30 DIAGNOSIS — O3503X0 Maternal care for (suspected) central nervous system malformation or damage in fetus, choroid plexus cysts, not applicable or unspecified: Secondary | ICD-10-CM

## 2020-09-30 DIAGNOSIS — Z3481 Encounter for supervision of other normal pregnancy, first trimester: Secondary | ICD-10-CM

## 2020-09-30 DIAGNOSIS — E669 Obesity, unspecified: Secondary | ICD-10-CM

## 2020-09-30 DIAGNOSIS — O358XX0 Maternal care for other (suspected) fetal abnormality and damage, not applicable or unspecified: Secondary | ICD-10-CM | POA: Diagnosis not present

## 2020-09-30 DIAGNOSIS — Z362 Encounter for other antenatal screening follow-up: Secondary | ICD-10-CM

## 2020-10-10 ENCOUNTER — Ambulatory Visit: Payer: 59

## 2020-11-25 ENCOUNTER — Other Ambulatory Visit: Payer: Self-pay | Admitting: *Deleted

## 2020-11-25 ENCOUNTER — Other Ambulatory Visit: Payer: Self-pay

## 2020-11-25 ENCOUNTER — Ambulatory Visit: Payer: 59 | Attending: Obstetrics and Gynecology

## 2020-11-25 DIAGNOSIS — Z362 Encounter for other antenatal screening follow-up: Secondary | ICD-10-CM | POA: Diagnosis present

## 2020-12-02 ENCOUNTER — Encounter: Payer: 59 | Admitting: Obstetrics and Gynecology

## 2020-12-02 ENCOUNTER — Other Ambulatory Visit: Payer: 59

## 2020-12-04 ENCOUNTER — Encounter: Payer: 59 | Admitting: Women's Health

## 2020-12-04 ENCOUNTER — Other Ambulatory Visit: Payer: 59

## 2020-12-11 ENCOUNTER — Encounter: Payer: Self-pay | Admitting: *Deleted

## 2020-12-11 ENCOUNTER — Encounter: Payer: 59 | Admitting: Medical

## 2020-12-13 ENCOUNTER — Other Ambulatory Visit: Payer: Self-pay

## 2020-12-13 ENCOUNTER — Other Ambulatory Visit: Payer: 59

## 2020-12-13 ENCOUNTER — Encounter: Payer: Self-pay | Admitting: Medical

## 2020-12-13 ENCOUNTER — Ambulatory Visit (INDEPENDENT_AMBULATORY_CARE_PROVIDER_SITE_OTHER): Payer: 59 | Admitting: Medical

## 2020-12-13 ENCOUNTER — Encounter: Payer: Self-pay | Admitting: Obstetrics and Gynecology

## 2020-12-13 VITALS — BP 100/67 | HR 92 | Wt 236.0 lb

## 2020-12-13 DIAGNOSIS — Z789 Other specified health status: Secondary | ICD-10-CM

## 2020-12-13 DIAGNOSIS — Z3480 Encounter for supervision of other normal pregnancy, unspecified trimester: Secondary | ICD-10-CM

## 2020-12-13 DIAGNOSIS — R8271 Bacteriuria: Secondary | ICD-10-CM

## 2020-12-13 DIAGNOSIS — Z23 Encounter for immunization: Secondary | ICD-10-CM

## 2020-12-13 DIAGNOSIS — Z603 Acculturation difficulty: Secondary | ICD-10-CM

## 2020-12-13 DIAGNOSIS — Z3A28 28 weeks gestation of pregnancy: Secondary | ICD-10-CM

## 2020-12-13 DIAGNOSIS — O350XX0 Maternal care for (suspected) central nervous system malformation in fetus, not applicable or unspecified: Secondary | ICD-10-CM

## 2020-12-13 DIAGNOSIS — Z6834 Body mass index (BMI) 34.0-34.9, adult: Secondary | ICD-10-CM

## 2020-12-13 DIAGNOSIS — O3503X0 Maternal care for (suspected) central nervous system malformation or damage in fetus, choroid plexus cysts, not applicable or unspecified: Secondary | ICD-10-CM | POA: Insufficient documentation

## 2020-12-13 NOTE — Progress Notes (Signed)
   PRENATAL VISIT NOTE  Subjective:  Marissa Reed is a 25 y.o. G2P1001 at [redacted]w[redacted]d being seen today for ongoing prenatal care.  She is currently monitored for the following issues for this low-risk pregnancy and has Language barrier affecting health care; Type 3a perineal laceration; Constipation; Breast pain in female; Encounter for supervision of normal pregnancy, antepartum; BMI 34.0-34.9,adult; Tension-type headache; Group B streptococcal bacteriuria; and Choroid plexus cyst of fetus in singleton pregnancy on their problem list.  Patient reports no complaints.  Contractions: Not present. Vag. Bleeding: None.  Movement: Present. Denies leaking of fluid.   The following portions of the patient's history were reviewed and updated as appropriate: allergies, current medications, past family history, past medical history, past social history, past surgical history and problem list.   Objective:   Vitals:   12/13/20 0908  BP: 100/67  Pulse: 92  Weight: 236 lb (107 kg)    Fetal Status: Fetal Heart Rate (bpm): 160   Movement: Present     General:  Alert, oriented and cooperative. Patient is in no acute distress.  Skin: Skin is warm and dry. No rash noted.   Cardiovascular: Normal heart rate noted  Respiratory: Normal respiratory effort, no problems with respiration noted  Abdomen: Soft, gravid, appropriate for gestational age.  Pain/Pressure: Absent     Pelvic: Cervical exam deferred        Extremities: Normal range of motion.  Edema: None  Mental Status: Normal mood and affect. Normal behavior. Normal judgment and thought content.   Assessment and Plan:  Pregnancy: G2P1001 at [redacted]w[redacted]d 1. Supervision of other normal pregnancy, antepartum - Tdap vaccine greater than or equal to 7yo IM - Glucose Tolerance, 2 Hours w/1 Hour - HIV Antibody (routine testing w rflx) - RPR - CBC  2. Group B streptococcal bacteriuria - treat in labor  3. BMI 34.0-34.9,adult - 15# TWG - Growth [place] scheduled  10/3, last [place] 8/22 EFW 49%  4. Language barrier affecting health care - Interpreter present   5. Choroid plexus cyst of fetus in singleton pregnancy - Resolved on 8/22 [place]  6. [redacted] weeks gestation of pregnancy  Preterm labor symptoms and general obstetric precautions including but not limited to vaginal bleeding, contractions, leaking of fluid and fetal movement were reviewed in detail with the patient. Please refer to After Visit Summary for other counseling recommendations.   Return in about 2 weeks (around 12/27/2020) for LOB, In-Person, Midwife preferred.  Future Appointments  Date Time Provider Department Center  12/13/2020 10:55 AM Marny Lowenstein, PA-C [facility] None  01/06/2021  2:30 PM [facility] NURSE [facility]  01/06/2021  2:45 PM [facility] US5 [facility]    Vonzella Nipple, PA-C

## 2020-12-13 NOTE — Progress Notes (Signed)
Pt presents for ROB and 2 gtt labs. Tdap given LD without difficulty today PHQ9=0 GAD7=0

## 2020-12-14 LAB — CBC
Hematocrit: 35.5 % (ref 34.0–46.6)
Hemoglobin: 11.8 g/dL (ref 11.1–15.9)
MCH: 29.8 pg (ref 26.6–33.0)
MCHC: 33.2 g/dL (ref 31.5–35.7)
MCV: 90 fL (ref 79–97)
Platelets: 258 10*3/uL (ref 150–450)
RBC: 3.96 x10E6/uL (ref 3.77–5.28)
RDW: 12.6 % (ref 11.7–15.4)
WBC: 7.7 10*3/uL (ref 3.4–10.8)

## 2020-12-14 LAB — GLUCOSE TOLERANCE, 2 HOURS W/ 1HR
Glucose, 1 hour: 139 mg/dL (ref 65–179)
Glucose, 2 hour: 130 mg/dL (ref 65–152)
Glucose, Fasting: 89 mg/dL (ref 65–91)

## 2020-12-14 LAB — RPR: RPR Ser Ql: NONREACTIVE

## 2020-12-14 LAB — HIV ANTIBODY (ROUTINE TESTING W REFLEX): HIV Screen 4th Generation wRfx: NONREACTIVE

## 2020-12-26 ENCOUNTER — Other Ambulatory Visit: Payer: Self-pay

## 2020-12-26 ENCOUNTER — Ambulatory Visit (INDEPENDENT_AMBULATORY_CARE_PROVIDER_SITE_OTHER): Payer: 59

## 2020-12-26 VITALS — BP 88/64 | HR 91 | Wt 241.0 lb

## 2020-12-26 DIAGNOSIS — Z3A30 30 weeks gestation of pregnancy: Secondary | ICD-10-CM

## 2020-12-26 DIAGNOSIS — Z6834 Body mass index (BMI) 34.0-34.9, adult: Secondary | ICD-10-CM

## 2020-12-26 DIAGNOSIS — Z3480 Encounter for supervision of other normal pregnancy, unspecified trimester: Secondary | ICD-10-CM

## 2020-12-26 DIAGNOSIS — Z789 Other specified health status: Secondary | ICD-10-CM

## 2020-12-26 NOTE — Progress Notes (Signed)
   LOW-RISK PREGNANCY OFFICE VISIT  Patient name: Marissa Reed MRN [id number]  Date of birth: 1995-04-13 Chief Complaint:   Routine Prenatal Visit  Subjective:   Taryne Kiger is a 25 y.o. G44P1001 female at [redacted]w[redacted]d with an Estimated Date of Delivery: 03/06/21 being seen today for ongoing management of a low-risk pregnancy aeb has Language barrier affecting health care; Type 3a perineal laceration; Constipation; Breast pain in female; Encounter for supervision of normal pregnancy, antepartum; BMI 34.0-34.9,adult; Tension-type headache; Group B streptococcal bacteriuria; and Choroid plexus cyst of fetus in singleton pregnancy on their problem list.  Patient presents today with no complaints.  Patient endorses fetal movement. Patient denies abdominal cramping or contractions.  Patient denies vaginal concerns including abnormal discharge, leaking of fluid, and bleeding.  Contractions: Not present. Vag. Bleeding: None.  Movement: Present.  Reviewed past medical,surgical, social, obstetrical and family history as well as problem list, medications and allergies.  Objective   Vitals:   12/26/20 0857  BP: (!) 88/64  Pulse: 91  Weight: 241 lb (109.3 kg)  Body mass index is 37.38 kg/m.  Total Weight Gain:20 lb (9.072 kg)         Physical Examination:   General appearance: Well appearing, and in no distress  Mental status: Alert, oriented to person, place, and time  Skin: Warm & dry  Cardiovascular: Normal heart rate noted  Respiratory: Normal respiratory effort, no distress  Abdomen: Soft, gravid, nontender, LGA with Fundal Height: 33 cm  Pelvic: Cervical exam deferred           Extremities: Edema: None  Fetal Status: Fetal Heart Rate (bpm): 150  Movement: Present   No results found for this or any previous visit (from the past 24 hour(s)).  Assessment & Plan:  Low-risk pregnancy of a 25 y.o., G2P1001 at [redacted]w[redacted]d with an Estimated Date of Delivery: 03/06/21   1. Supervision of other normal  pregnancy, antepartum -Anticipatory guidance for upcoming appts. -Patient to schedule next appt in 2-3 weeks for an in-person visit.  2. [redacted] weeks gestation of pregnancy -Doing well. -Reports some mild cramping yesterday. -Encouraged increased water intake.   3. BMI 34.0-34.9,adult -FH at 33cm today -[place] scheduled for 10/3  4. Language barrier affecting health care -In person interpreter present.       Meds: No orders of the defined types were placed in this encounter.  Labs/procedures today:  Lab Orders  No laboratory test(s) ordered today     Reviewed: Preterm labor symptoms and general obstetric precautions including but not limited to vaginal bleeding, contractions, leaking of fluid and fetal movement were reviewed in detail with the patient.  All questions were answered.  Follow-up: Return in about 3 weeks (around 01/16/2021) for LROB-Arabic Speaking.  No orders of the defined types were placed in this encounter.  Cherre Robins MSN, CNM 12/26/2020

## 2020-12-26 NOTE — Progress Notes (Signed)
Pt reports fetal movement, denies pain.  

## 2021-01-06 ENCOUNTER — Encounter: Payer: Self-pay | Admitting: *Deleted

## 2021-01-06 ENCOUNTER — Ambulatory Visit: Payer: 59 | Attending: Maternal & Fetal Medicine

## 2021-01-06 ENCOUNTER — Other Ambulatory Visit: Payer: Self-pay

## 2021-01-06 ENCOUNTER — Ambulatory Visit: Payer: 59 | Admitting: *Deleted

## 2021-01-06 VITALS — BP 104/64 | HR 112

## 2021-01-06 DIAGNOSIS — Z362 Encounter for other antenatal screening follow-up: Secondary | ICD-10-CM | POA: Diagnosis present

## 2021-01-06 DIAGNOSIS — Z3480 Encounter for supervision of other normal pregnancy, unspecified trimester: Secondary | ICD-10-CM | POA: Insufficient documentation

## 2021-01-06 DIAGNOSIS — E669 Obesity, unspecified: Secondary | ICD-10-CM | POA: Diagnosis not present

## 2021-01-06 DIAGNOSIS — Z3A31 31 weeks gestation of pregnancy: Secondary | ICD-10-CM

## 2021-01-06 DIAGNOSIS — O99212 Obesity complicating pregnancy, second trimester: Secondary | ICD-10-CM

## 2021-01-06 DIAGNOSIS — O358XX0 Maternal care for other (suspected) fetal abnormality and damage, not applicable or unspecified: Secondary | ICD-10-CM

## 2021-01-16 ENCOUNTER — Ambulatory Visit (INDEPENDENT_AMBULATORY_CARE_PROVIDER_SITE_OTHER): Payer: 59

## 2021-01-16 ENCOUNTER — Other Ambulatory Visit: Payer: Self-pay

## 2021-01-16 VITALS — BP 111/72 | HR 113 | Wt 245.1 lb

## 2021-01-16 DIAGNOSIS — Z3483 Encounter for supervision of other normal pregnancy, third trimester: Secondary | ICD-10-CM | POA: Diagnosis not present

## 2021-01-16 DIAGNOSIS — Z3A33 33 weeks gestation of pregnancy: Secondary | ICD-10-CM | POA: Diagnosis not present

## 2021-01-16 DIAGNOSIS — Z3480 Encounter for supervision of other normal pregnancy, unspecified trimester: Secondary | ICD-10-CM

## 2021-01-16 DIAGNOSIS — Z789 Other specified health status: Secondary | ICD-10-CM

## 2021-01-16 NOTE — Progress Notes (Signed)
   LOW-RISK PREGNANCY OFFICE VISIT  Patient name: Marissa Reed MRN [id number]  Date of birth: [birth year] Chief Complaint:   Routine Prenatal Visit  Subjective:   Marissa Reed is a 25 y.o. G67P1001 female at [redacted]w[redacted]d with an Estimated Date of Delivery: 03/06/21 being seen today for ongoing management of a low-risk pregnancy aeb has Language barrier affecting health care; Type 3a perineal laceration; Constipation; Breast pain in female; Encounter for supervision of normal pregnancy, antepartum; BMI 34.0-34.9,adult; Tension-type headache; Group B streptococcal bacteriuria; and Choroid plexus cyst of fetus in singleton pregnancy on their problem list.  Patient presents today with  lower abdominal cramping-occassional .  Patient endorses fetal movement. Patient denies contractions.  Patient denies vaginal concerns including abnormal discharge, leaking of fluid, and bleeding.  Contractions: Not present. Vag. Bleeding: None.  Movement: Present.  Reviewed past medical,surgical, social, obstetrical and family history as well as problem list, medications and allergies.  Objective   Vitals:   01/16/21 1009  BP: 111/72  Pulse: (!) 113  Weight: 245 lb 1.6 oz (111.2 kg)  Body mass index is 38.02 kg/m.  Total Weight Gain:24 lb 1.6 oz (10.9 kg)         Physical Examination:   General appearance: Well appearing, and in no distress  Mental status: Alert, oriented to person, place, and time  Skin: Warm & dry  Cardiovascular: Normal heart rate noted  Respiratory: Normal respiratory effort, no distress  Abdomen: Soft, gravid, nontender, AGA with Fundal Height: 33 cm  Pelvic: Cervical exam deferred           Extremities: Edema: None  Fetal Status: Fetal Heart Rate (bpm): 138  Movement: Present   No results found for this or any previous visit (from the past 24 hour(s)).  Assessment & Plan:  Low-risk pregnancy of a 25 y.o., G2P1001 at [redacted]w[redacted]d with an Estimated Date of Delivery: 03/06/21   1. Supervision of  other normal pregnancy, antepartum -Anticipatory guidance for upcoming appts. -Patient to schedule next appt in 3 weeks for an in-person visit. -Informed that GBS culture was positive in urine, but will repeat STD testing at that time.  2. [redacted] weeks gestation of pregnancy -Doing well overall. -Reviewed c/o abdominal cramping. -Encouraged proper hydration, rest, and usage of maternity belt/band. -Precautions reviewed.   3. Language barrier -Interpretations completed with assistance of in-person interpreter Darlis Loan.     Meds: No orders of the defined types were placed in this encounter.  Labs/procedures today:  Lab Orders  No laboratory test(s) ordered today     Reviewed: Preterm labor symptoms and general obstetric precautions including but not limited to vaginal bleeding, contractions, leaking of fluid and fetal movement were reviewed in detail with the patient.  All questions were answered.  Follow-up: Return in about 3 weeks (around 02/06/2021) for LROB with GC/CT, Arabic Speaking.  No orders of the defined types were placed in this encounter.  Cherre Robins MSN, CNM 01/16/2021

## 2021-01-20 ENCOUNTER — Other Ambulatory Visit: Payer: Self-pay

## 2021-01-20 ENCOUNTER — Inpatient Hospital Stay (HOSPITAL_COMMUNITY)
Admission: AD | Admit: 2021-01-20 | Discharge: 2021-01-23 | DRG: 831 | Disposition: A | Discharge status: Other Acute Inpatient Hospital | Payer: 59 | Attending: Family Medicine | Admitting: Family Medicine

## 2021-01-20 ENCOUNTER — Encounter (HOSPITAL_COMMUNITY): Payer: Self-pay | Admitting: Family Medicine

## 2021-01-20 DIAGNOSIS — O42913 Preterm premature rupture of membranes, unspecified as to length of time between rupture and onset of labor, third trimester: Secondary | ICD-10-CM | POA: Diagnosis present

## 2021-01-20 DIAGNOSIS — O9882 Other maternal infectious and parasitic diseases complicating childbirth: Secondary | ICD-10-CM | POA: Diagnosis not present

## 2021-01-20 DIAGNOSIS — B951 Streptococcus, group B, as the cause of diseases classified elsewhere: Secondary | ICD-10-CM | POA: Diagnosis not present

## 2021-01-20 DIAGNOSIS — O358XX0 Maternal care for other (suspected) fetal abnormality and damage, not applicable or unspecified: Secondary | ICD-10-CM | POA: Diagnosis not present

## 2021-01-20 DIAGNOSIS — Z3A34 34 weeks gestation of pregnancy: Secondary | ICD-10-CM | POA: Diagnosis not present

## 2021-01-20 DIAGNOSIS — Z20822 Contact with and (suspected) exposure to covid-19: Secondary | ICD-10-CM | POA: Diagnosis present

## 2021-01-20 DIAGNOSIS — O42919 Preterm premature rupture of membranes, unspecified as to length of time between rupture and onset of labor, unspecified trimester: Secondary | ICD-10-CM | POA: Diagnosis not present

## 2021-01-20 DIAGNOSIS — O09293 Supervision of pregnancy with other poor reproductive or obstetric history, third trimester: Secondary | ICD-10-CM | POA: Diagnosis not present

## 2021-01-20 DIAGNOSIS — O99824 Streptococcus B carrier state complicating childbirth: Secondary | ICD-10-CM | POA: Diagnosis present

## 2021-01-20 DIAGNOSIS — O99213 Obesity complicating pregnancy, third trimester: Secondary | ICD-10-CM | POA: Diagnosis not present

## 2021-01-20 DIAGNOSIS — O411230 Chorioamnionitis, third trimester, not applicable or unspecified: Secondary | ICD-10-CM | POA: Diagnosis present

## 2021-01-20 DIAGNOSIS — O42113 Preterm premature rupture of membranes, onset of labor more than 24 hours following rupture, third trimester: Secondary | ICD-10-CM | POA: Diagnosis not present

## 2021-01-20 DIAGNOSIS — Z8759 Personal history of other complications of pregnancy, childbirth and the puerperium: Secondary | ICD-10-CM | POA: Diagnosis not present

## 2021-01-20 DIAGNOSIS — Z3A33 33 weeks gestation of pregnancy: Secondary | ICD-10-CM | POA: Diagnosis not present

## 2021-01-20 DIAGNOSIS — O99892 Other specified diseases and conditions complicating childbirth: Secondary | ICD-10-CM | POA: Diagnosis present

## 2021-01-20 DIAGNOSIS — E669 Obesity, unspecified: Secondary | ICD-10-CM | POA: Diagnosis not present

## 2021-01-20 DIAGNOSIS — Z3480 Encounter for supervision of other normal pregnancy, unspecified trimester: Secondary | ICD-10-CM

## 2021-01-20 DIAGNOSIS — R000 Tachycardia, unspecified: Secondary | ICD-10-CM | POA: Diagnosis present

## 2021-01-20 DIAGNOSIS — R8271 Bacteriuria: Secondary | ICD-10-CM

## 2021-01-20 DIAGNOSIS — O42013 Preterm premature rupture of membranes, onset of labor within 24 hours of rupture, third trimester: Secondary | ICD-10-CM | POA: Diagnosis not present

## 2021-01-20 HISTORY — DX: Preterm premature rupture of membranes, unspecified as to length of time between rupture and onset of labor, unspecified trimester: O42.919

## 2021-01-20 LAB — RESP PANEL BY RT-PCR (FLU A&B, COVID) ARPGX2
Influenza A by PCR: NEGATIVE
Influenza B by PCR: NEGATIVE
SARS Coronavirus 2 by RT PCR: NEGATIVE

## 2021-01-20 LAB — CBC
HCT: 36.3 % (ref 36.0–46.0)
Hemoglobin: 12 g/dL (ref 12.0–15.0)
MCH: 29.9 pg (ref 26.0–34.0)
MCHC: 33.1 g/dL (ref 30.0–36.0)
MCV: 90.3 fL (ref 80.0–100.0)
Platelets: 245 10*3/uL (ref 150–400)
RBC: 4.02 MIL/uL (ref 3.87–5.11)
RDW: 13.6 % (ref 11.5–15.5)
WBC: 9.1 10*3/uL (ref 4.0–10.5)
nRBC: 0 % (ref 0.0–0.2)

## 2021-01-20 LAB — TYPE AND SCREEN
ABO/RH(D): O POS
Antibody Screen: NEGATIVE

## 2021-01-20 LAB — RPR: RPR Ser Ql: NONREACTIVE

## 2021-01-20 LAB — POCT FERN TEST: POCT Fern Test: POSITIVE

## 2021-01-20 MED ORDER — PRENATAL COMPLETE 14-0.4 MG PO TABS: 1.0000 | ORAL_TABLET | Freq: Every day | ORAL | Status: DC

## 2021-01-20 MED ORDER — ZOLPIDEM TARTRATE 5 MG PO TABS: 5.0000 mg | ORAL_TABLET | Freq: Every evening | ORAL | Status: DC | PRN

## 2021-01-20 MED ORDER — ACETAMINOPHEN 325 MG PO TABS
650.0000 mg | ORAL_TABLET | ORAL | Status: DC | PRN
  Administered 2021-01-20 – 2021-01-23 (×4): 650 mg via ORAL
  Filled 2021-01-20 (×4): qty 2

## 2021-01-20 MED ORDER — DOCUSATE SODIUM 100 MG PO CAPS
100.0000 mg | ORAL_CAPSULE | Freq: Every day | ORAL | Status: DC
  Administered 2021-01-20 – 2021-01-23 (×4): 100 mg via ORAL
  Filled 2021-01-20 (×4): qty 1

## 2021-01-20 MED ORDER — LACTATED RINGERS IV SOLN: INTRAVENOUS | Status: DC

## 2021-01-20 MED ORDER — PRENATAL MULTIVITAMIN CH
1.0000 | ORAL_TABLET | Freq: Every day | ORAL | Status: DC
  Administered 2021-01-20 – 2021-01-23 (×4): 1 via ORAL
  Filled 2021-01-20 (×4): qty 1

## 2021-01-20 MED ORDER — CALCIUM CARBONATE ANTACID 500 MG PO CHEW: 2.0000 | CHEWABLE_TABLET | ORAL | Status: DC | PRN

## 2021-01-20 MED ORDER — BETAMETHASONE SOD PHOS & ACET 6 (3-3) MG/ML IJ SUSP
12.0000 mg | INTRAMUSCULAR | Status: AC
Start: 2021-01-20 — End: 2021-01-21
  Administered 2021-01-20 – 2021-01-21 (×2): 12 mg via INTRAMUSCULAR
  Filled 2021-01-20: qty 5

## 2021-01-20 MED ORDER — AMPICILLIN SODIUM 2 G IJ SOLR
2.0000 g | Freq: Four times a day (QID) | INTRAMUSCULAR | Status: AC
  Administered 2021-01-20 – 2021-01-22 (×7): 2 g via INTRAVENOUS
  Filled 2021-01-20 (×8): qty 2000

## 2021-01-20 MED ORDER — AMOXICILLIN 500 MG PO CAPS
500.0000 mg | ORAL_CAPSULE | Freq: Three times a day (TID) | ORAL | Status: DC
  Administered 2021-01-22 – 2021-01-23 (×5): 500 mg via ORAL
  Filled 2021-01-20 (×5): qty 1

## 2021-01-20 MED ORDER — AZITHROMYCIN 250 MG PO TABS
1000.0000 mg | ORAL_TABLET | Freq: Once | ORAL | Status: AC
  Administered 2021-01-20: 1000 mg via ORAL
  Filled 2021-01-20: qty 4

## 2021-01-20 NOTE — H&P (Signed)
History     CSN: [id number]  Arrival date and time: 01/20/21 9983   Event Date/Time   First Provider Initiated Contact with Patient 01/20/21 0825      Chief Complaint  Patient presents with   Contractions   Rupture of Membranes   HPI This is a 25yo G2P1001 at [redacted]w[redacted]d with an uncomplicated pregnancy who presents with leaking fluid that started around 6:30 this morning.  Patient had a big gush of fluid followed by continued leaking.  No palliating or provoking factors.  Has mild cramps occasionally, but nothing consistent.  Feeling the baby move -no decreased fetal movement.  Denies fevers, chills, nausea, vomiting.  First pregnancy was uncomplicated and resulted in a vaginal delivery.  OB History     Gravida  2   Para  1   Term  1   Preterm      AB      Living  1      SAB      IAB      Ectopic      Multiple  0   Live Births  1           History reviewed. No pertinent past medical history.  Past Surgical History:  Procedure Laterality Date   TONSILLECTOMY      Family History  Problem Relation Age of Onset   Diabetes Mother    Diabetes Father     Social History   Tobacco Use   Smoking status: Never   Smokeless tobacco: Never  Vaping Use   Vaping Use: Never used  Substance Use Topics   Alcohol use: Never   Drug use: Never    Allergies: No Known Allergies  Medications Prior to Admission  Medication Sig Dispense Refill Last Dose   Prenatal Vit-Fe Fumarate-FA (M-NATAL PLUS) 27-1 MG TABS Take 1 tablet by mouth daily.   01/19/2021   Prenatal Vit-Fe Fumarate-FA (PRENATAL COMPLETE) 14-0.4 MG TABS Take 1 tablet by mouth daily. 90 tablet 3    promethazine (PHENERGAN) 25 MG tablet Take 1 tablet (25 mg total) by mouth every 6 (six) hours as needed for nausea or vomiting. (Patient not taking: No sig reported) 90 tablet 1     Review of Systems Physical Exam   Blood pressure 113/79, pulse (!) 116, temperature 97.9 F (36.6 C), temperature source  Oral, resp. rate 19, last menstrual period 05/25/2020, SpO2 100 %, not currently breastfeeding.  Physical Exam Vitals and nursing note reviewed.  Constitutional:      Appearance: Normal appearance.  Cardiovascular:     Rate and Rhythm: Normal rate.  Pulmonary:     Effort: Pulmonary effort is normal.     Breath sounds: Normal breath sounds.  Abdominal:     General: Abdomen is flat. There is no distension.     Palpations: There is no mass.     Tenderness: There is no abdominal tenderness. There is no guarding or rebound.     Hernia: No hernia is present.  Skin:    General: Skin is warm and dry.     Capillary Refill: Capillary refill takes less than 2 seconds.  Neurological:     General: No focal deficit present.     Mental Status: She is alert.  Psychiatric:        Mood and Affect: Mood normal.        Behavior: Behavior normal.        Thought Content: Thought content normal.  Judgment: Judgment normal.    MAU Course  Procedures NST:  Baseline: 140s  Variability: moderate Accelerations: +  Decelerations: none Contractions: none  Pt informed that the ultrasound is considered a limited OB ultrasound and is not intended to be a complete ultrasound exam.  Patient also informed that the ultrasound is not being completed with the intent of assessing for fetal or placental anomalies or any pelvic abnormalities.  Explained that the purpose of today's ultrasound is to assess for  presentation.  Patient acknowledges the purpose of the exam and the limitations of the study.    Baby is in vtx position  MDM   Assessment and Plan   1. Supervision of other normal pregnancy, antepartum   2. [redacted] weeks gestation of pregnancy   3. Preterm premature rupture of membranes (PPROM) with unknown onset of labor   4. Group B streptococcal bacteriuria    Admit to Ante Consult NICU BMZ 12mg q24 hours x2 doses Urine culture GC/CT culture Start latency antibiotics (ampicillin and  azithromycin) Discussed delivery around 34 weeks.  Jaelee Laughter J Cedrica Brune 01/20/2021, 8:26 AM  

## 2021-01-20 NOTE — MAU Note (Addendum)
...  Marissa Reed is a 25 y.o. at [redacted]w[redacted]d here in MAU reporting: Patient states her water broke around 0630. Yellow fluids, no vaginal bleeding. +FM. She is endorsing occasional CTX's but denies any pain with them.  GBS+. Arabic speaking.  Female providers preferred.

## 2021-01-20 NOTE — Progress Notes (Signed)
FACULTY PRACTICE ANTEPARTUM COMPREHENSIVE PROGRESS NOTE  Marissa Reed is a 25 y.o. G2P1001 at [redacted]w[redacted]d who is admitted for PPROM.  Estimated Date of Delivery: 03/06/21 Fetal presentation is cephalic.  Length of Stay:  0 Days. Admitted 01/20/2021  Subjective:  Patient reports good fetal movement.  She reports no uterine contractions, no bleeding.   Vitals:  Blood pressure 104/61, pulse (!) 107, temperature 98 F (36.7 C), temperature source Oral, resp. rate 18, height 5' 6.14" (1.68 m), weight 104.3 kg, last menstrual period 05/25/2020, SpO2 100 %, not currently breastfeeding. Physical Examination: CONSTITUTIONAL: Well-developed, well-nourished female in no acute distress.  NEUROLOGIC: Alert and oriented to person, place, and time. No cranial nerve deficit noted. PSYCHIATRIC: Normal mood and affect. Normal behavior. Normal judgment and thought content. CARDIOVASCULAR: Normal heart rate noted, regular rhythm RESPIRATORY: Effort and breath sounds normal, no problems with respiration noted MUSCULOSKELETAL: Normal range of motion. No edema and no tenderness. 2+ distal pulses. ABDOMEN: Soft, nontender, nondistended, gravid. CERVIX:  not examined  Fetal monitoring: FHR: 160 bpm, Variability: moderate, Accelerations: Present, Decelerations: Absent  Uterine activity: no contractions per hour  Results for orders placed or performed during the hospital encounter of 01/20/21 (from the past 48 hour(s))  Resp Panel by RT-PCR (Flu A&B, Covid) Nasopharyngeal Swab     Status: None   Collection Time: 01/20/21  8:06 AM   Specimen: Nasopharyngeal Swab; Nasopharyngeal(NP) swabs in vial transport medium  Result Value Ref Range   SARS Coronavirus 2 by RT PCR NEGATIVE NEGATIVE    Comment: (NOTE) SARS-CoV-2 target nucleic acids are NOT DETECTED.  The SARS-CoV-2 RNA is generally detectable in upper respiratory specimens during the acute phase of infection. The lowest concentration of SARS-CoV-2 viral copies  this assay can detect is 138 copies/mL. A negative result does not preclude SARS-Cov-2 infection and should not be used as the sole basis for treatment or other patient management decisions. A negative result may occur with  improper specimen collection/handling, submission of specimen other than nasopharyngeal swab, presence of viral mutation(s) within the areas targeted by this assay, and inadequate number of viral copies(<138 copies/mL). A negative result must be combined with clinical observations, patient history, and epidemiological information. The expected result is Negative.  Fact Sheet for Patients:  BloggerCourse.com  Fact Sheet for Healthcare Providers:  SeriousBroker.it  This test is no t yet approved or cleared by the [place] FDA and  has been authorized for detection and/or diagnosis of SARS-CoV-2 by FDA under an Emergency Use Authorization (EUA). This EUA will remain  in effect (meaning this test can be used) for the duration of the COVID-19 declaration under Section 564(b)(1) of the Act, 21 U.S.C.section 360bbb-3(b)(1), unless the authorization is terminated  or revoked sooner.       Influenza A by PCR NEGATIVE NEGATIVE   Influenza B by PCR NEGATIVE NEGATIVE    Comment: (NOTE) The Xpert Xpress SARS-CoV-2/FLU/RSV plus assay is intended as an aid in the diagnosis of influenza from Nasopharyngeal swab specimens and should not be used as a sole basis for treatment. Nasal washings and aspirates are unacceptable for Xpert Xpress SARS-CoV-2/FLU/RSV testing.  Fact Sheet for Patients: BloggerCourse.com  Fact Sheet for Healthcare Providers: SeriousBroker.it  This test is not yet approved or cleared by the [place] FDA and has been authorized for detection and/or diagnosis of SARS-CoV-2 by FDA under an Emergency Use Authorization (EUA). This EUA will  remain in effect (meaning this test can be used) for the duration of the COVID-19 declaration  under Section 564(b)(1) of the Act, 21 U.S.C. section 360bbb-3(b)(1), unless the authorization is terminated or revoked.  Performed at [facility], [address] [place], [place] [address]   CBC     Status: None   Collection Time: 01/20/21  8:06 AM  Result Value Ref Range   WBC 9.1 4.0 - 10.5 K/uL   RBC 4.02 3.87 - 5.11 MIL/uL   Hemoglobin 12.0 12.0 - 15.0 g/dL   HCT 95.6 38.7 - 56.4 %   MCV 90.3 80.0 - 100.0 fL   MCH 29.9 26.0 - 34.0 pg   MCHC 33.1 30.0 - 36.0 g/dL   RDW 33.2 95.1 - 88.4 %   Platelets 245 150 - 400 K/uL   nRBC 0.0 0.0 - 0.2 %    Comment: Performed at [facility], [address] [place], [place] [address]  Type and screen [facility]     Status: None   Collection Time: 01/20/21  8:06 AM  Result Value Ref Range   ABO/RH(D) O POS    Antibody Screen NEG    Sample Expiration      01/23/2021,2359 Performed at [facility], [address] [place], [place] [address]   Crist Fat Test     Status: None   Collection Time: 01/20/21  8:20 AM  Result Value Ref Range   POCT Fern Test Positive = ruptured amniotic membanes     No results found.  Current scheduled medications  [START ON 01/22/2021] amoxicillin  500 mg Oral TID   betamethasone acetate-betamethasone sodium phosphate  12 mg Intramuscular Q24H   docusate sodium  100 mg Oral Daily   prenatal multivitamin  1 tablet Oral Q1200    I have reviewed the patient's current medications.  ASSESSMENT: Active Problems:   Preterm premature rupture of membranes (PPROM) with unknown onset of labor   PLAN: PPROM - BMZ 1/2 at 845 on 10/17 - Latency ABX initiated - She is GBS bactiuria - PCN in labor - Plan for NICU consult - Plan for monitoring at this time will be NST daily along with BPP weekly - Last growth 10/3: 46%ile, 1843g, cephalic at that time.  - GC/CT culture done in  triage - Discussed if labor/ctxns do not ensure and no other OB indication I.e. infection, abruption etc, the current recommendation for timing of delivery is at 34w. We discussed that some patients after consider the risks of prematurity vs the risk of remaining pregnant (infection, stillbirth, abruption), that they desire to remain pregnant beyond this time period I.e. to 35-36 weeks. Reviewed at this time, our plan of care will be to have her speak with the NICU physicians and understand better the care at each potential GA.   Arabic Speaking - Interpreter used throughout the interview Leone Payor #[id number]   Continue routine antenatal care.   Milas Hock, MD, FACOG Obstetrician & Gynecologist, [facility] for [facility], [facility]

## 2021-01-20 NOTE — MAU Provider Note (Signed)
History     CSN: [id number]  Arrival date and time: 01/20/21 2683   Event Date/Time   First Provider Initiated Contact with Patient 01/20/21 0825      Chief Complaint  Patient presents with   Contractions   Rupture of Membranes   HPI This is a 25yo G2P1001 at [redacted]w[redacted]d with an uncomplicated pregnancy who presents with leaking fluid that started around 6:30 this morning.  Patient had a big gush of fluid followed by continued leaking.  No palliating or provoking factors.  Has mild cramps occasionally, but nothing consistent.  Feeling the baby move -no decreased fetal movement.  Denies fevers, chills, nausea, vomiting.  First pregnancy was uncomplicated and resulted in a vaginal delivery.  OB History     Gravida  2   Para  1   Term  1   Preterm      AB      Living  1      SAB      IAB      Ectopic      Multiple  0   Live Births  1           History reviewed. No pertinent past medical history.  Past Surgical History:  Procedure Laterality Date   TONSILLECTOMY      Family History  Problem Relation Age of Onset   Diabetes Mother    Diabetes Father     Social History   Tobacco Use   Smoking status: Never   Smokeless tobacco: Never  Vaping Use   Vaping Use: Never used  Substance Use Topics   Alcohol use: Never   Drug use: Never    Allergies: No Known Allergies  Medications Prior to Admission  Medication Sig Dispense Refill Last Dose   Prenatal Vit-Fe Fumarate-FA (M-NATAL PLUS) 27-1 MG TABS Take 1 tablet by mouth daily.   01/19/2021   Prenatal Vit-Fe Fumarate-FA (PRENATAL COMPLETE) 14-0.4 MG TABS Take 1 tablet by mouth daily. 90 tablet 3    promethazine (PHENERGAN) 25 MG tablet Take 1 tablet (25 mg total) by mouth every 6 (six) hours as needed for nausea or vomiting. (Patient not taking: No sig reported) 90 tablet 1     Review of Systems Physical Exam   Blood pressure 113/79, pulse (!) 116, temperature 97.9 F (36.6 C), temperature source  Oral, resp. rate 19, last menstrual period 05/25/2020, SpO2 100 %, not currently breastfeeding.  Physical Exam Vitals and nursing note reviewed.  Constitutional:      Appearance: Normal appearance.  Cardiovascular:     Rate and Rhythm: Normal rate.  Pulmonary:     Effort: Pulmonary effort is normal.     Breath sounds: Normal breath sounds.  Abdominal:     General: Abdomen is flat. There is no distension.     Palpations: There is no mass.     Tenderness: There is no abdominal tenderness. There is no guarding or rebound.     Hernia: No hernia is present.  Skin:    General: Skin is warm and dry.     Capillary Refill: Capillary refill takes less than 2 seconds.  Neurological:     General: No focal deficit present.     Mental Status: She is alert.  Psychiatric:        Mood and Affect: Mood normal.        Behavior: Behavior normal.        Thought Content: Thought content normal.  Judgment: Judgment normal.    MAU Course  Procedures NST:  Baseline: 140s  Variability: moderate Accelerations: +  Decelerations: none Contractions: none  Pt informed that the ultrasound is considered a limited OB ultrasound and is not intended to be a complete ultrasound exam.  Patient also informed that the ultrasound is not being completed with the intent of assessing for fetal or placental anomalies or any pelvic abnormalities.  Explained that the purpose of today's ultrasound is to assess for  presentation.  Patient acknowledges the purpose of the exam and the limitations of the study.    Baby is in vtx position  MDM   Assessment and Plan   1. Supervision of other normal pregnancy, antepartum   2. [redacted] weeks gestation of pregnancy   3. Preterm premature rupture of membranes (PPROM) with unknown onset of labor   4. Group B streptococcal bacteriuria    Admit to Ante Consult NICU BMZ 12mg  q24 hours x2 doses Urine culture GC/CT culture Start latency antibiotics (ampicillin and  azithromycin) Discussed delivery around 34 weeks.  01/20/2021, 8:26 AM

## 2021-01-21 ENCOUNTER — Inpatient Hospital Stay (HOSPITAL_BASED_OUTPATIENT_CLINIC_OR_DEPARTMENT_OTHER): Payer: 59

## 2021-01-21 DIAGNOSIS — O42919 Preterm premature rupture of membranes, unspecified as to length of time between rupture and onset of labor, unspecified trimester: Secondary | ICD-10-CM

## 2021-01-21 DIAGNOSIS — Z3A33 33 weeks gestation of pregnancy: Secondary | ICD-10-CM

## 2021-01-21 DIAGNOSIS — O358XX0 Maternal care for other (suspected) fetal abnormality and damage, not applicable or unspecified: Secondary | ICD-10-CM | POA: Diagnosis not present

## 2021-01-21 DIAGNOSIS — O99213 Obesity complicating pregnancy, third trimester: Secondary | ICD-10-CM | POA: Diagnosis not present

## 2021-01-21 DIAGNOSIS — E669 Obesity, unspecified: Secondary | ICD-10-CM | POA: Diagnosis not present

## 2021-01-21 DIAGNOSIS — O09293 Supervision of pregnancy with other poor reproductive or obstetric history, third trimester: Secondary | ICD-10-CM

## 2021-01-21 LAB — GC/CHLAMYDIA PROBE AMP (~~LOC~~) NOT AT ARMC
Chlamydia: NEGATIVE
Comment: NEGATIVE
Comment: NORMAL
Neisseria Gonorrhea: NEGATIVE

## 2021-01-21 LAB — URINE CULTURE: Culture: NO GROWTH

## 2021-01-21 NOTE — Progress Notes (Signed)
FACULTY PRACTICE ANTEPARTUM COMPREHENSIVE PROGRESS NOTE  Marissa Reed is a 25 y.o. G2P1001 at [redacted]w[redacted]d who is admitted for PPROM.  Estimated Date of Delivery: 03/06/21 Fetal presentation is cephalic.  Length of Stay:  1 Days. Admitted 01/20/2021  Subjective:  Patient reports good fetal movement.  She reports no uterine contractions, no bleeding. She had some contractions yesterday but none today.  Vitals:  Blood pressure 112/66, pulse 100, temperature 98.7 F (37.1 C), resp. rate 18, height 5' 6.14" (1.68 m), weight 104.3 kg, last menstrual period 05/25/2020, SpO2 99 %, not currently breastfeeding. Physical Examination: CONSTITUTIONAL: Well-developed, well-nourished female in no acute distress.  NEUROLOGIC: Alert and oriented to person, place, and time. No cranial nerve deficit noted. PSYCHIATRIC: Normal mood and affect. Normal behavior. Normal judgment and thought content. CARDIOVASCULAR: Normal heart rate noted, regular rhythm RESPIRATORY: Effort and breath sounds normal, no problems with respiration noted MUSCULOSKELETAL: Normal range of motion. No edema and no tenderness. 2+ distal pulses. ABDOMEN: Soft, nontender, nondistended, gravid. CERVIX:  not examined  Fetal monitoring: FHR: 150 bpm, Variability: moderate, Accelerations: Present, Decelerations: Absent  Uterine activity: no contractions per hour    Current scheduled medications  [START ON 01/22/2021] amoxicillin  500 mg Oral TID   docusate sodium  100 mg Oral Daily   prenatal multivitamin  1 tablet Oral Q1200    I have reviewed the patient's current medications.  ASSESSMENT: Active Problems:   Preterm premature rupture of membranes (PPROM) with unknown onset of labor   PLAN: PPROM - BMZ 2/2 at 845 on 10/18 - she will be s/p tomorrow - Latency ABX - She is GBS bactiuria - PCN in labor - Awaiting NICU consult - NST daily, BPP 10/18 was 8/8, oligo noted but MVP >2 - Last growth 10/3: 46%ile, 1843g, cephalic at that  time.  - GC/CT culture done in triage - Discussed if labor/ctxns do not ensure and no other OB indication I.e. infection, abruption etc, the current recommendation for timing of delivery is at 34w. We discussed that some patients after consider the risks of prematurity vs the risk of remaining pregnant (infection, stillbirth, abruption), that they desire to remain pregnant beyond this time period I.e. to 35-36 weeks. Reviewed at this time, our plan of care will be to have her speak with the NICU physicians and understand better the care at each potential GA   Arabic Speaking - Interpreter used throughout the interview Annabelle Harman [phone]   Continue routine antenatal care.   Milas Hock, MD, FACOG Obstetrician & Gynecologist, [facility] for [facility], [facility]

## 2021-01-22 NOTE — Progress Notes (Signed)
FACULTY PRACTICE ANTEPARTUM COMPREHENSIVE PROGRESS NOTE  Marissa Reed is a 25 y.o. G2P1001 at [redacted]w[redacted]d who is admitted for PPROM.  Estimated Date of Delivery: 03/06/21 Fetal presentation is cephalic.  Length of Stay:  2 Days. Admitted 01/20/2021  Subjective:  Patient reports good fetal movement.  She reports no uterine contractions, no bleeding, or fever.   Vitals:  Blood pressure (!) 107/57, pulse 83, temperature 98.4 F (36.9 C), temperature source Oral, resp. rate 18, height 5' 6.14" (1.68 m), weight 104.3 kg, last menstrual period 05/25/2020, SpO2 98 %, not currently breastfeeding. Physical Examination: CONSTITUTIONAL: Well-developed, well-nourished female in no acute distress.  NEUROLOGIC: Alert and oriented to person, place, and time. No cranial nerve deficit noted. PSYCHIATRIC: Normal mood and affect. Normal behavior. Normal judgment and thought content. CARDIOVASCULAR: Normal heart rate noted, regular rhythm RESPIRATORY: Effort and breath sounds normal, no problems with respiration noted MUSCULOSKELETAL: Normal range of motion. No edema and no tenderness. 2+ distal pulses. ABDOMEN: Soft, nontender, nondistended, gravid. CERVIX:  not examined  Fetal monitoring: FHR: 150 bpm, Variability: moderate, Accelerations: Present, Decelerations: Absent  Uterine activity: no contractions per hour    Current scheduled medications  amoxicillin  500 mg Oral TID   docusate sodium  100 mg Oral Daily   prenatal multivitamin  1 tablet Oral Q1200    CBC Latest Ref Rng & Units 01/20/2021 12/13/2020 08/08/2020  WBC 4.0 - 10.5 K/uL 9.1 7.7 5.7  Hemoglobin 12.0 - 15.0 g/dL 10.1 75.1 02.5  Hematocrit 36.0 - 46.0 % 36.3 35.5 38.0  Platelets 150 - 400 K/uL 245 258 329    I have reviewed the patient's current medications.  ASSESSMENT: Active Problems:   Preterm premature rupture of membranes (PPROM) with unknown onset of labor   PLAN: PPROM -  s/p BMZ 10/17-18 - Latency ABX - She is GBS  bactiuria - PCN in labor - NST daily, BPP 10/18 was 8/8, oligo noted but MVP >2 - Last growth 10/3: 46%ile, 1843g, cephalic at that time.  - GC/CT negative 10/17 - Discussed if labor/ctxns do not ensure and no other OB indication I.e. infection, abruption etc, the current standard of care for timing of delivery is at 34w. We discussed that based on some recent large trials, some patients after consider the risks of prematurity vs the risk of remaining pregnant (infection, stillbirth, abruption), decide that they desire to remain pregnant beyond this time period I.e. to 35-36 weeks. We then today had a joint consult with myself and NICU, Dr. Joycelyn Man. We went through the risks of delivery in terms of prematurity vs the risks of remaining pregnant. Based on her current status, the risk of either choice is overall low. We discussed if she desires delivery at 34w, we would recommend transfer to [facility] at this time due to the capacity of the NICU. We discussed if she desires expectant management, we would reevaluate prior to 35 wks or at any point she may change her mind. We also discussed if she developed bleeding, signs of infection or if labor started, at that point delivery would be advised regardless of GA. Additionally, she may choose to do expectant management but may change her mind at any point. All questions were answered by myself and Dr. Joycelyn Man in a shared decision making set up. We also discussed that we would both be willing to come back at any point to review any additional questions. She would like to consider her options and discuss with her family at this time.  Arabic Speaking - Interpreter used throughout the interview    Continue routine antenatal care.   Milas Hock, MD, FACOG Obstetrician & Gynecologist, [facility] for [facility], [facility]

## 2021-01-22 NOTE — Consult Note (Signed)
   Consultation Service: Neonatology   Dr. Paula Duncan has asked for consultation on Marissa Reed regarding the care of a premature infant at [redacted]w[redacted]d. Thank you for inviting us to see this patient.   Reason for consult:  Explain the possible complications, the prognosis, and the care of a premature infant at 33 and 6/7 weeks. Also review options regarding expectant management vs. IOL at 34 weeks.  Chief complaint: 25 y.o. female with a singleton IUP with an unknown estimated weight (Last growth 10/3: 46%ile, 1843g, cephalic at that time). Pregnancy has been complicated by PPROM.  Per OB team maternal health is excellent (no fevers, no hemodynamic instability, no bleeding, etc) and all fetal monitoring has been reassuring. She is betamethazone complete and is receiving latency antibiotics. Plan is for vaginal delivery if possible.  My key findings of this patient's HPI are:  I have reviewed the patient's chart and have met with her. The salient information is as follows:   Prenatal labs: GBS positive, remaining serologies negative   Prenatal care:   good Pregnancy complications:  PPROM Maternal antibiotics: Amp and Azithro Maternal Steroids: yes Most recent dose:  10/18    Discussed expectant management (no IOL at 34 weeks) vs. IOL at 34 weeks and risks to mom/fetus in both scenarios jointly with OB (placental abruption, emergent c-section, fetal distress, infection, miscarriage and the risks of prematurity at 34 weeks)   My recommendations for this patient and my actions included:   1. In the presence of the Margrit Wilcoxen and the arabic interpreter, I spent 30 minutes discussing the possible complications and outcomes of prematurity at this gestational age. I discussed specific complications at this gestational age referencing feeding difficulty and the need for gavage feeds and IV fluids, antibiotics for possible sepsis, temperature support, and monitoring. I also discussed the potential  risk of complications which are minimal considering the almost [redacted] week gestation. I discussed this with the mom in detail and they expressed an understanding of the risks and complications of prematurity.   2. I also discussed the expected survival of an infant born at 34 weeks, which is good. We further discussed that very few of the neonates born at this age have any neurological complications and school difficulties. She expressed an understanding of this information.   3. Dr. Paula Duncan and I also informed her that given her stable clinical status and lack of labor we could continue to expectantly monitor her and hold off on IOL at 34 weeks. She understands the risk associated with expectant management (placental abruption, emergent c-section, fetal distress, infection, miscarriage) although feels reassured that her baby's fetal status has been reassuring.    Final Impression:  25 y.o. female with a 33w 6d IUP who is threatening to deliver and who now understands the possible complications and prognosis of her infant. The mother is hoping to continue thinking about her decision to proceed with IOL at 34 vs. expectant management. Sweet Behrman's questions were answered.    ______________________________________________________________________  Thank you for asking us to participate in the care of this patient. Please do not hesitate to contact us again if you are aware of any further ways we can be of assistance.   Sincerely,  Christopher R. Zimmerman, MD Attending Neonatologist   I spent ~40 minutes in consultation time, of which 25 minutes was spent in direct face to face counseling.   

## 2021-01-23 ENCOUNTER — Inpatient Hospital Stay (HOSPITAL_BASED_OUTPATIENT_CLINIC_OR_DEPARTMENT_OTHER): Payer: 59

## 2021-01-23 ENCOUNTER — Other Ambulatory Visit: Payer: Self-pay

## 2021-01-23 ENCOUNTER — Inpatient Hospital Stay
Admission: RE | Admit: 2021-01-23 | Discharge: 2021-01-25 | DRG: 805 | Disposition: A | Discharge status: Home / Self Care | Payer: 59 | Attending: Obstetrics and Gynecology | Admitting: Obstetrics and Gynecology

## 2021-01-23 ENCOUNTER — Inpatient Hospital Stay (HOSPITAL_COMMUNITY): Payer: 59

## 2021-01-23 ENCOUNTER — Encounter: Payer: Self-pay | Admitting: Obstetrics and Gynecology

## 2021-01-23 DIAGNOSIS — O99213 Obesity complicating pregnancy, third trimester: Secondary | ICD-10-CM

## 2021-01-23 DIAGNOSIS — R000 Tachycardia, unspecified: Secondary | ICD-10-CM | POA: Diagnosis present

## 2021-01-23 DIAGNOSIS — O411230 Chorioamnionitis, third trimester, not applicable or unspecified: Secondary | ICD-10-CM | POA: Diagnosis present

## 2021-01-23 DIAGNOSIS — O99824 Streptococcus B carrier state complicating childbirth: Secondary | ICD-10-CM | POA: Diagnosis present

## 2021-01-23 DIAGNOSIS — O42113 Preterm premature rupture of membranes, onset of labor more than 24 hours following rupture, third trimester: Secondary | ICD-10-CM | POA: Diagnosis not present

## 2021-01-23 DIAGNOSIS — O42013 Preterm premature rupture of membranes, onset of labor within 24 hours of rupture, third trimester: Secondary | ICD-10-CM | POA: Diagnosis not present

## 2021-01-23 DIAGNOSIS — O42913 Preterm premature rupture of membranes, unspecified as to length of time between rupture and onset of labor, third trimester: Secondary | ICD-10-CM | POA: Diagnosis present

## 2021-01-23 DIAGNOSIS — Z3490 Encounter for supervision of normal pregnancy, unspecified, unspecified trimester: Secondary | ICD-10-CM

## 2021-01-23 DIAGNOSIS — O42919 Preterm premature rupture of membranes, unspecified as to length of time between rupture and onset of labor, unspecified trimester: Secondary | ICD-10-CM

## 2021-01-23 DIAGNOSIS — Z3A34 34 weeks gestation of pregnancy: Secondary | ICD-10-CM

## 2021-01-23 DIAGNOSIS — O358XX0 Maternal care for other (suspected) fetal abnormality and damage, not applicable or unspecified: Secondary | ICD-10-CM | POA: Diagnosis not present

## 2021-01-23 DIAGNOSIS — O4290 Premature rupture of membranes, unspecified as to length of time between rupture and onset of labor, unspecified weeks of gestation: Secondary | ICD-10-CM | POA: Diagnosis present

## 2021-01-23 DIAGNOSIS — B951 Streptococcus, group B, as the cause of diseases classified elsewhere: Secondary | ICD-10-CM | POA: Diagnosis not present

## 2021-01-23 DIAGNOSIS — R8271 Bacteriuria: Secondary | ICD-10-CM | POA: Diagnosis present

## 2021-01-23 DIAGNOSIS — O99892 Other specified diseases and conditions complicating childbirth: Secondary | ICD-10-CM | POA: Diagnosis present

## 2021-01-23 DIAGNOSIS — O9882 Other maternal infectious and parasitic diseases complicating childbirth: Secondary | ICD-10-CM | POA: Diagnosis not present

## 2021-01-23 DIAGNOSIS — E669 Obesity, unspecified: Secondary | ICD-10-CM | POA: Diagnosis not present

## 2021-01-23 DIAGNOSIS — Z3480 Encounter for supervision of other normal pregnancy, unspecified trimester: Secondary | ICD-10-CM

## 2021-01-23 LAB — CBC
HCT: 34.2 % — ABNORMAL LOW (ref 36.0–46.0)
HCT: 33.1 % — ABNORMAL LOW (ref 36.0–46.0)
Hemoglobin: 11.2 g/dL — ABNORMAL LOW (ref 12.0–15.0)
Hemoglobin: 11.3 g/dL — ABNORMAL LOW (ref 12.0–15.0)
MCH: 29.7 pg (ref 26.0–34.0)
MCH: 30.5 pg (ref 26.0–34.0)
MCHC: 32.7 g/dL (ref 30.0–36.0)
MCHC: 34.1 g/dL (ref 30.0–36.0)
MCV: 90.7 fL (ref 80.0–100.0)
MCV: 89.2 fL (ref 80.0–100.0)
Platelets: 248 10*3/uL (ref 150–400)
Platelets: 254 10*3/uL (ref 150–400)
RBC: 3.77 MIL/uL — ABNORMAL LOW (ref 3.87–5.11)
RBC: 3.71 MIL/uL — ABNORMAL LOW (ref 3.87–5.11)
RDW: 13.9 % (ref 11.5–15.5)
RDW: 14 % (ref 11.5–15.5)
WBC: 15.9 10*3/uL — ABNORMAL HIGH (ref 4.0–10.5)
WBC: 20.8 10*3/uL — ABNORMAL HIGH (ref 4.0–10.5)
nRBC: 0 % (ref 0.0–0.2)
nRBC: 0 % (ref 0.0–0.2)

## 2021-01-23 LAB — TYPE AND SCREEN
ABO/RH(D): O POS
ABO/RH(D): O POS
Antibody Screen: NEGATIVE
Antibody Screen: NEGATIVE

## 2021-01-23 LAB — RAPID HIV SCREEN (HIV 1/2 AB+AG)
HIV 1/2 Antibodies: NONREACTIVE
HIV-1 P24 Antigen - HIV24: NONREACTIVE

## 2021-01-23 MED ORDER — MISOPROSTOL 200 MCG PO TABS
ORAL_TABLET | ORAL | Status: AC
  Filled 2021-01-23: qty 4

## 2021-01-23 MED ORDER — LACTATED RINGERS IV SOLN: INTRAVENOUS | Status: DC

## 2021-01-23 MED ORDER — PHENYLEPHRINE 40 MCG/ML (10ML) SYRINGE FOR IV PUSH (FOR BLOOD PRESSURE SUPPORT): 80.0000 ug | PREFILLED_SYRINGE | INTRAVENOUS | Status: DC | PRN

## 2021-01-23 MED ORDER — PENICILLIN G POT IN DEXTROSE 60000 UNIT/ML IV SOLN
3.0000 10*6.[IU] | INTRAVENOUS | Status: DC
Start: 2021-01-24 — End: 2021-01-23

## 2021-01-23 MED ORDER — LIDOCAINE HCL (PF) 1 % IJ SOLN
30.0000 mL | INTRAMUSCULAR | Status: DC | PRN
  Filled 2021-01-23: qty 30

## 2021-01-23 MED ORDER — PIPERACILLIN-TAZOBACTAM 3.375 G IVPB 30 MIN
3.3750 g | Freq: Three times a day (TID) | INTRAVENOUS | Status: DC
  Administered 2021-01-23: 3.375 g via INTRAVENOUS
  Filled 2021-01-23 (×3): qty 50

## 2021-01-23 MED ORDER — SODIUM CHLORIDE 0.9 % IV SOLN
5.0000 10*6.[IU] | Freq: Once | INTRAVENOUS | Status: AC
  Administered 2021-01-23: 5 10*6.[IU] via INTRAVENOUS
  Filled 2021-01-23: qty 5

## 2021-01-23 MED ORDER — PENICILLIN G POT IN DEXTROSE 60000 UNIT/ML IV SOLN: 3.0000 10*6.[IU] | INTRAVENOUS | Status: DC

## 2021-01-23 MED ORDER — DIPHENHYDRAMINE HCL 50 MG/ML IJ SOLN: 12.5000 mg | INTRAMUSCULAR | Status: DC | PRN

## 2021-01-23 MED ORDER — BUTORPHANOL TARTRATE 1 MG/ML IJ SOLN: 2.0000 mg | INTRAMUSCULAR | Status: DC | PRN

## 2021-01-23 MED ORDER — OXYTOCIN-SODIUM CHLORIDE 30-0.9 UT/500ML-% IV SOLN
2.5000 [IU]/h | INTRAVENOUS | Status: DC
  Administered 2021-01-24: 2.5 [IU]/h via INTRAVENOUS

## 2021-01-23 MED ORDER — ONDANSETRON HCL 4 MG/2ML IJ SOLN
4.0000 mg | Freq: Four times a day (QID) | INTRAMUSCULAR | Status: DC | PRN
  Administered 2021-01-24: 4 mg via INTRAVENOUS
  Filled 2021-01-23: qty 2

## 2021-01-23 MED ORDER — FENTANYL-BUPIVACAINE-NACL 0.5-0.125-0.9 MG/250ML-% EP SOLN
12.0000 mL/h | EPIDURAL | Status: DC | PRN
Start: 2021-01-23 — End: 2021-01-24
  Administered 2021-01-24: 12 mL/h via EPIDURAL
  Filled 2021-01-23: qty 250

## 2021-01-23 MED ORDER — OXYTOCIN BOLUS FROM INFUSION
333.0000 mL | Freq: Once | INTRAVENOUS | Status: AC
  Administered 2021-01-24: 333 mL via INTRAVENOUS

## 2021-01-23 MED ORDER — TERBUTALINE SULFATE 1 MG/ML IJ SOLN: 0.2500 mg | Freq: Once | INTRAMUSCULAR | Status: DC | PRN

## 2021-01-23 MED ORDER — ACETAMINOPHEN 325 MG PO TABS
650.0000 mg | ORAL_TABLET | ORAL | Status: DC | PRN
Start: 2021-01-23 — End: 2021-01-24

## 2021-01-23 MED ORDER — LACTATED RINGERS IV SOLN
500.0000 mL | Freq: Once | INTRAVENOUS | Status: AC
Start: 2021-01-23 — End: 2021-01-24
  Administered 2021-01-24: 500 mL via INTRAVENOUS

## 2021-01-23 MED ORDER — AMMONIA AROMATIC IN INHA
RESPIRATORY_TRACT | Status: AC
  Filled 2021-01-23: qty 10

## 2021-01-23 MED ORDER — OXYTOCIN-SODIUM CHLORIDE 30-0.9 UT/500ML-% IV SOLN
1.0000 m[IU]/min | INTRAVENOUS | Status: DC
  Administered 2021-01-24: 2 m[IU]/min via INTRAVENOUS
  Filled 2021-01-23 (×2): qty 500

## 2021-01-23 MED ORDER — SOD CITRATE-CITRIC ACID 500-334 MG/5ML PO SOLN: 30.0000 mL | ORAL | Status: DC | PRN

## 2021-01-23 MED ORDER — EPHEDRINE 5 MG/ML INJ: 10.0000 mg | INTRAVENOUS | Status: DC | PRN

## 2021-01-23 MED ORDER — OXYTOCIN 10 UNIT/ML IJ SOLN
INTRAMUSCULAR | Status: AC
  Filled 2021-01-23: qty 2

## 2021-01-23 MED ORDER — LACTATED RINGERS IV SOLN: 500.0000 mL | INTRAVENOUS | Status: DC | PRN

## 2021-01-23 MED ORDER — SODIUM CHLORIDE 0.9 % IV SOLN: 5.0000 10*6.[IU] | Freq: Once | INTRAVENOUS | Status: DC

## 2021-01-23 NOTE — Anesthesia Preprocedure Evaluation (Signed)
Anesthesia Evaluation  Patient identified by MRN, date of birth, ID band Patient awake    Reviewed: Allergy & Precautions, Patient's Chart, lab work & pertinent test results  History of Anesthesia Complications Negative for: history of anesthetic complications  Airway Mallampati: III  TM Distance: >3 FB Neck ROM: Full    Dental no notable dental hx.    Pulmonary neg pulmonary ROS,    Pulmonary exam normal        Cardiovascular negative cardio ROS Normal cardiovascular exam     Neuro/Psych negative neurological ROS  negative psych ROS   GI/Hepatic negative GI ROS, Neg liver ROS,   Endo/Other  negative endocrine ROS  Renal/GU negative Renal ROS  negative genitourinary   Musculoskeletal negative musculoskeletal ROS (+)   Abdominal (+) + obese,   Peds  Hematology negative hematology ROS (+)   Anesthesia Other Findings Pt was admitted for PPROM on 01/20/2021. She has received betamethasone. She had GBS bacteruria early in her pregnancy. She has been receiving ampicillin this am. She began contracting today. Pitocin will be started due to elevated WBC fetal tachycardia. Pt is afebrile with stable vital signs. She denies fever/chills. She denies having abdominal pain besides contractions. Zosyn was administered recently.    Reproductive/Obstetrics (+) Pregnancy                            Anesthesia Physical  Anesthesia Plan  ASA: 3  Anesthesia Plan: Epidural   Post-op Pain Management:    Induction:   PONV Risk Score and Plan: Treatment may vary due to age or medical condition  Airway Management Planned: Natural Airway  Additional Equipment:   Intra-op Plan:   Post-operative Plan:   Informed Consent: I have reviewed the patients History and Physical, chart, labs and discussed the procedure including the risks, benefits and alternatives for the proposed anesthesia with the patient or  authorized representative who has indicated his/her understanding and acceptance.       Plan Discussed with: Anesthesiologist  Anesthesia Plan Comments: (The risk of an epidural infection was discussed thoroughly with the patient. She has received antibiotic coverage and her infectious symptoms and WBC are borderline for chorioamnionitis. Risk for epidural abscess is low and patient would like to proceed.  )       Anesthesia Quick Evaluation

## 2021-01-23 NOTE — Discharge Summary (Signed)
Antenatal Physician Discharge Summary  Patient ID: Marissa Reed MRN: [id number] DOB/AGE: 07-12-1995 25 y.o.  Admit date: 01/20/2021 Discharge date: 01/23/2021  Admission Diagnoses: PROM at 33.4   Discharge Diagnoses: premature rupture of membranes , 34 weeks, preterm contractions  Prenatal Procedures: ultrasound  Consults: Neonatology  Hospital Course:  Marissa Reed is a 25 y.o. G2P1001 with IUP at [redacted]w[redacted]d admitted for PROM confirmed with ferning.  She was admitted with confirmed PROM.  Initially no uterine activity.  She was admitted to the antepartum, started on latency antibiotics and also received betamethasone x 2 doses.  She was seen by Neonatology during her stay.  She was observed, fetal heart rate monitoring remained reassuring.  During her stay the NICU was noted to be full and staffing was an issue.  She was offered transfer then, but declined.  On 10/20 patient started to have more regular uterine activity rated as 5/10 and about every 5 minutes.  Cvx checked and she was 3.5 cm.  Transfer arranged to [facility] for improved NICU census and care.  Discharge Exam: Temp:  [97.9 F (36.6 C)-98.9 F (37.2 C)] 98.9 F (37.2 C) (10/20 2005) Pulse Rate:  [83-104] 104 (10/20 2005) Resp:  [16-18] 18 (10/20 2005) BP: (90-106)/(46-68) 98/53 (10/20 2005) SpO2:  [98 %-100 %] 98 % (10/20 2005) Physical Examination: CONSTITUTIONAL: Well-developed, well-nourished female in no acute distress.  HENT:  Normocephalic, atraumatic, External right and left ear normal. Oropharynx is clear and moist EYES: Conjunctivae and EOM are normal.  NECK: Normal range of motion, supple, no masses SKIN: Skin is warm and dry. No rash noted. Not diaphoretic. No erythema. No pallor. NEUROLGIC: Alert and oriented to person, place, and time. Normal reflexes, muscle tone coordination. No cranial nerve deficit noted. PSYCHIATRIC: Normal mood and affect. Normal behavior. Normal judgment and thought  content. CARDIOVASCULAR: Normal heart rate noted, regular rhythm RESPIRATORY: Effort and breath sounds normal, no problems with respiration noted MUSCULOSKELETAL: Normal range of motion. No edema and no tenderness. 2+ distal pulses. ABDOMEN: Soft, nontender, nondistended, gravid. CERVIX: Dilation: 3.5 Effacement (%): 70 Station: -2 Presentation: Vertex Exam by:: Chriss Driver, RN3.5/70/-2 per nursing exam, MD was in the room  Significant Diagnostic Studies:  Results for orders placed or performed during the hospital encounter of 01/20/21 (from the past 168 hour(s))  Resp Panel by RT-PCR (Flu A&B, Covid) Nasopharyngeal Swab   Collection Time: 01/20/21  8:06 AM   Specimen: Nasopharyngeal Swab; Nasopharyngeal(NP) swabs in vial transport medium  Result Value Ref Range   SARS Coronavirus 2 by RT PCR NEGATIVE NEGATIVE   Influenza A by PCR NEGATIVE NEGATIVE   Influenza B by PCR NEGATIVE NEGATIVE  CBC   Collection Time: 01/20/21  8:06 AM  Result Value Ref Range   WBC 9.1 4.0 - 10.5 K/uL   RBC 4.02 3.87 - 5.11 MIL/uL   Hemoglobin 12.0 12.0 - 15.0 g/dL   HCT 60.1 09.3 - 23.5 %   MCV 90.3 80.0 - 100.0 fL   MCH 29.9 26.0 - 34.0 pg   MCHC 33.1 30.0 - 36.0 g/dL   RDW 57.3 22.0 - 25.4 %   Platelets 245 150 - 400 K/uL   nRBC 0.0 0.0 - 0.2 %  RPR   Collection Time: 01/20/21  8:06 AM  Result Value Ref Range   RPR Ser Ql NON REACTIVE NON REACTIVE  Type and screen [facility]   Collection Time: 01/20/21  8:06 AM  Result Value Ref Range   ABO/RH(D) O POS  Antibody Screen NEG    Sample Expiration      01/23/2021,2359 Performed at [facility], [address], [place], [place] [address]   Crist Fat Test   Collection Time: 01/20/21  8:20 AM  Result Value Ref Range   POCT Fern Test Positive = ruptured amniotic membanes   GC/Chlamydia probe amp ([place])not at [facility]   Collection Time: 01/20/21  8:23 AM  Result Value Ref Range   Neisseria Gonorrhea Negative     Chlamydia Negative    Comment Normal Reference Ranger Chlamydia - Negative    Comment      Normal Reference Range Neisseria Gonorrhea - Negative  Urine Culture   Collection Time: 01/20/21 12:00 PM   Specimen: Urine, Clean Catch  Result Value Ref Range   Specimen Description URINE, CLEAN CATCH    Special Requests NONE    Culture      NO GROWTH Performed at [facility], [address] [place], [place] [address]    Report Status 01/21/2021 FINAL   CBC   Collection Time: 01/23/21  8:12 AM  Result Value Ref Range   WBC 15.9 (H) 4.0 - 10.5 K/uL   RBC 3.77 (L) 3.87 - 5.11 MIL/uL   Hemoglobin 11.2 (L) 12.0 - 15.0 g/dL   HCT 09.8 (L) 11.9 - 14.7 %   MCV 90.7 80.0 - 100.0 fL   MCH 29.7 26.0 - 34.0 pg   MCHC 32.7 30.0 - 36.0 g/dL   RDW 82.9 56.2 - 13.0 %   Platelets 248 150 - 400 K/uL   nRBC 0.0 0.0 - 0.2 %  Type and screen [facility]   Collection Time: 01/23/21  8:12 AM  Result Value Ref Range   ABO/RH(D) O POS    Antibody Screen NEG    Sample Expiration      01/26/2021,2359 Performed at [facility], [address] [place], [place] [address]    [place] MFM FETAL BPP WO NON STRESS  Result Date: 01/23/2021 ----------------------------------------------------------------------  OBSTETRICS REPORT                       (Signed Final 01/23/2021 03:19 pm) ---------------------------------------------------------------------- Patient Info  ID #:       [id number]                          D.O.B.:  06-29-1995 (25 yrs)  Name:       Marissa Reed                    Visit Date: 01/23/2021 01:25 pm ---------------------------------------------------------------------- Performed By  Attending:        Lin Landsman      Ref. Address:     Faculty                    MD  Performed By:     Sandi Mealy        [facility]  Referred By:      Catalina Antigua MD ---------------------------------------------------------------------- Orders  #  Description                           Code        Ordered By  1  [place] MFM FETAL BPP WO NON               [address].60    PAULA DUNCAN     STRESS ----------------------------------------------------------------------  #  Order #                     Accession #                Episode #  1  [id number]                   [id number]                 [id number] ---------------------------------------------------------------------- Indications  [redacted] weeks gestation of pregnancy                Z3A.34  Premature rupture of membranes - leaking       O42.90  fluid  Obesity complicating pregnancy, second         O99.212  trimester BMI=34  Fetal choroid plexus cyst- resolved            O35.8XX0  Neg AFP; LR NIPS (female) ---------------------------------------------------------------------- Fetal Evaluation  Num Of Fetuses:         1  Fetal Heart Rate(bpm):  162  Cardiac Activity:       Observed  Presentation:           Cephalic  Placenta:               Anterior  P. Cord Insertion:      Previously Visualized  Amniotic Fluid  AFI FV:      Within normal limits  AFI Sum(cm)     %Tile       Largest Pocket(cm)  10.2            20          4.5  RUQ(cm)       RLQ(cm)       LUQ(cm)        LLQ(cm)  4.5           1.3           3              1.4 ---------------------------------------------------------------------- Biophysical Evaluation  Amniotic F.V:   Within normal limits       F. Tone:        Observed  F. Movement:    Observed                   Score:          6/8  F. Breathing:   Not Observed ---------------------------------------------------------------------- OB History  Gravidity:  2         Term:   1  Living:       1 ---------------------------------------------------------------------- Gestational Age  LMP:           34w  5d        Date:  05/25/20                 EDD:   03/01/21  Best:          34w 0d     Det. By:  Marcella Dubs         EDD:   03/06/21                                      (08/01/20) ---------------------------------------------------------------------- Impression  Antenatal testing performed given maternal premature  rupture of membranes  The biophysical profile was 6/8 with good fetal movement and  amniotic fluid volume.  Ms. Everetts had a reactive NST per provider notes therefore  the score is 8/10 which is reassuring. ---------------------------------------------------------------------- Recommendations  Continue daily NST with weekly BPP. ----------------------------------------------------------------------               Lin Landsman, MD Electronically Signed Final Report   01/23/2021 03:19 pm ----------------------------------------------------------------------  [place] MFM FETAL BPP WO NON STRESS  Result Date: 01/21/2021 ----------------------------------------------------------------------  OBSTETRICS REPORT                       (Signed Final 01/21/2021 05:56 pm) ---------------------------------------------------------------------- Patient Info  ID #:       [id number]                          D.O.B.:  02-10-1996 (25 yrs)  Name:       Marissa Reed                    Visit Date: 01/21/2021 08:30 am ---------------------------------------------------------------------- Performed By  Attending:        Lin Landsman      Ref. Address:     Faculty                    MD  Performed By:     Hurman Horn          [facility]  Referred By:      Catalina Antigua MD ---------------------------------------------------------------------- Orders  #  Description                           Code        Ordered By  1  [place] MFM FETAL BPP WO NON               91478.29    PAULA DUNCAN     STRESS  ----------------------------------------------------------------------  #  Order #  Accession #                Episode #  1  [id number]                   [id number]                 [id number] ---------------------------------------------------------------------- Indications  [redacted] weeks gestation of pregnancy                Z3A.33  Premature rupture of membranes - leaking       O42.90  fluid  Obesity complicating pregnancy, second         O99.212  trimester BMI=34  Fetal choroid plexus cyst- resolved            O35.8XX0  Neg AFP; LR NIPS (female) ---------------------------------------------------------------------- Fetal Evaluation  Num Of Fetuses:         1  Fetal Heart Rate(bpm):  166  Cardiac Activity:       Observed  Presentation:           Cephalic  Placenta:               Anterior  P. Cord Insertion:      Visualized, central  Amniotic Fluid  AFI FV:      Oligohydramnios  AFI Sum(cm)     %Tile       Largest Pocket(cm)  2.3             < 3         2.3  RUQ(cm)  2.3  Comment:    Stomach and bladder seen. ---------------------------------------------------------------------- Biophysical Evaluation  Amniotic F.V:   Pocket => 2 cm             F. Tone:        Observed  F. Movement:    Observed                   Score:          8/8  F. Breathing:   Observed ---------------------------------------------------------------------- OB History  Gravidity:    2         Term:   1  Living:       1 ---------------------------------------------------------------------- Gestational Age  LMP:           34w 3d        Date:  05/25/20                 EDD:   03/01/21  Best:          33w 5d     Det. ByMarcella Dubs         EDD:   03/06/21                                      (08/01/20) ---------------------------------------------------------------------- Cervix Uterus Adnexa  Cervix  Not visualized (advanced GA >24wks)  Uterus  No abnormality visualized.  Right Ovary  Within normal limits.  Left Ovary  Within normal  limits.  Adnexa  No abnormality visualized. ---------------------------------------------------------------------- Impression  Antenatal testing performed given maternal premature  rupture of membranes.  The biophysical profile was 8/8 with good fetal movement and  oligohydramnios ---------------------------------------------------------------------- Recommendations  Continue weekly testing with daily NST. ----------------------------------------------------------------------               Lin Landsman, MD Electronically Signed Final Report  01/21/2021 05:56 pm ----------------------------------------------------------------------  [place] MFM OB FOLLOW UP  Result Date: 01/06/2021 ----------------------------------------------------------------------  OBSTETRICS REPORT                       (Signed Final 01/06/2021 03:39 pm) ---------------------------------------------------------------------- Patient Info  ID #:       [id number]                          D.O.B.:  1995/05/07 (25 yrs)  Name:       Marissa Reed                    Visit Date: 01/06/2021 03:16 pm ---------------------------------------------------------------------- Performed By  Attending:        Ma Rings MD         Ref. Address:     Faculty  Performed By:     Clayton Lefort RDMS       [facility]  Referred By:      Catalina Antigua MD ---------------------------------------------------------------------- Orders  #  Description                           Code        Ordered By  1  [place] MFM OB FOLLOW UP                   79892.11    Lin Landsman ----------------------------------------------------------------------  #  Order #                      Accession #                Episode #  1  [id number]                   [id number]  [id number] ---------------------------------------------------------------------- Indications  Obesity complicating pregnancy, second         O99.212  trimester BMI=34  [redacted] weeks gestation of pregnancy                Z3A.31  Fetal choroid plexus cyst- resolved            O35.8XX0  Neg AFP; LR NIPS (female) ---------------------------------------------------------------------- Fetal Evaluation  Num Of Fetuses:         1  Cardiac Activity:       Observed  Presentation:           Cephalic  Placenta:               Anterior  P. Cord Insertion:      Previously Visualized  Amniotic Fluid  AFI FV:      Within normal limits  AFI Sum(cm)     %Tile       Largest Pocket(cm)  10.56           20          4.01  RUQ(cm)       RLQ(cm)       LUQ(cm)        LLQ(cm)  4.01          3.35          1.6            1.6 ---------------------------------------------------------------------- Biometry  BPD:      83.9  mm     G. Age:  33w 5d         93  %    CI:        75.84   %    70 - 86                                                          FL/HC:      20.2   %    19.1 - 21.3  HC:      305.4  mm     G. Age:  34w 0d         78  %    HC/AC:      1.14        0.96 - 1.17  AC:      268.7  mm     G. Age:  31w 0d         30  %    FL/BPD:     73.5   %    71 - 87  FL:       61.7  mm     G. Age:  32w 0d         48  %    FL/AC:      23.0   %    20 - 24  LV:        3.3  mm  Est. FW:    1843  gm      4 lb 1 oz     46  % ---------------------------------------------------------------------- OB History  Gravidity:    2         Term:   1  Living:       1 ---------------------------------------------------------------------- Gestational Age  LMP:  32w 2d        Date:  05/25/20                 EDD:   03/01/21  U/S Today:     32w 5d                                        EDD:   02/26/21  Best:          31w 4d     Det. ByMarcella Dubs         EDD:    03/06/21                                      (08/01/20) ---------------------------------------------------------------------- Anatomy  Cranium:               Appears normal         LVOT:                   Previously seen  Cavum:                 Previously seen        Aortic Arch:            Previously seen  Ventricles:            Appears normal         Ductal Arch:            Previously seen  Choroid Plexus:        Previously seen        Diaphragm:              Previously seen  Cerebellum:            Previously seen        Stomach:                Appears normal, left                                                                        sided  Posterior Fossa:       Previously seen        Abdomen:                Previously seen  Nuchal Fold:           Not applicable (>20    Abdominal Wall:         Previously seen                         wks GA)  Face:                  Orbits and profile     Cord Vessels:           Previously seen                         previously seen  Lips:  Previously seen        Kidneys:                Appear normal  Palate:                Previously seen        Bladder:                Appears normal  Thoracic:              Previously seen        Spine:                  Previously seen  Heart:                 Appears normal         Upper Extremities:      Previously seen                         (4CH, axis, and                         situs)  RVOT:                  Previously seen        Lower Extremities:      Previously seen  Other:  Fetus appears to be a female. Nasal bone and lenses prev visualized.          Heels/feet and open hands  visualized.  VC, 3VV and 3VTV          previously visualized. ---------------------------------------------------------------------- Cervix Uterus Adnexa  Cervix  Not visualized (advanced GA >24wks)  Right Ovary  Visualized.  Left Ovary  Visualized. ---------------------------------------------------------------------- Comments  This patient was  seen for a follow up growth scan due to  maternal obesity.  She denies any problems since her last  exam  She was informed that the fetal growth and amniotic fluid  level appears appropriate for her gestational age.  As the fetal growth is within normal limits, no further exams  were scheduled in our office.  All conversations were held with the patient today with the  help of a Spanish interpreter. ----------------------------------------------------------------------                   Ma Rings, MD Electronically Signed Final Report   01/06/2021 03:39 pm ----------------------------------------------------------------------   Future Appointments  Date Time Provider Department Center  02/06/2021 10:55 AM Gerrit Heck, CNM [facility] None    Discharge Condition: Stable  Transfer to [facility], continue IV penicillin due to known GBS bacturia      Total discharge time: 45 minutes   Signed: Warden Fillers M.D. 01/23/2021, 8:26 PM

## 2021-01-23 NOTE — Progress Notes (Signed)
Pt. transported via [facility] to [facility] Charity fundraiser and OB rapid response RN at bedside. Pt. alert and oriented x4 upon transport.

## 2021-01-23 NOTE — Progress Notes (Signed)
Pt transferred from [facility] in [place] via [facility]. Interpreter [phone] used for admission

## 2021-01-23 NOTE — H&P (Signed)
Marissa Reed is an 25 y.o. female.   Chief Complaint: PPROM HPI: Patient was transferred from [facility] because their NICU was at capacity. Patient was admitted for PPROM on 01/20/2021. She has received betamethasone. She had GBS bacteruria early in her pregnancy. She has been receiving PCN since earlier today when her contractions started. Prior to transfer she was reported to be 3 cm on cervical exam. Her pregnancy has been uncomplicated. She reports She is feeling contractions every 4 minutes since this morning. She notes mucous discharge today. She denies fevers or muscles aches but does report fatigue.    08/01/20 Problems (from 08/01/20 to present)     Problem Noted Resolved   Encounter for supervision of normal pregnancy, antepartum 08/01/2020 by Hamilton Capri, RN No   Overview Addendum 01/23/2021  9:41 PM by Natale Milch, MD     Nursing Staff Provider  Office Location  [facility] Dating   9 week [place]  Language  Arabic Anatomy US  [facility] on anatomy, resolved on 8/22  Flu Vaccine  Received Genetic/Carrier Screen  NIPS:  Low risk female  AFP: Negative  Horizon: not performed  TDaP Vaccine   12/13/20 Hgb A1C or  GTT Early  Third trimester : 2 hr normal  COVID Vaccine Vaccinated   LAB RESULTS   Rhogam  N/A Blood Type O/Positive/-- (05/05 1149)   Baby Feeding Plan Breast and Bottle Antibody Negative (05/05 1149)  Contraception Nexplanon or IUD Rubella 2.60 (05/05 1149)  Circumcision Yes RPR Non Reactive (05/05 1149)   Pediatrician  [facility] HBsAg Negative (05/05 1149)   Support Person Ahmed-Husband HCVAb Negative  Prenatal Classes no HIV Non Reactive (05/05 1149)     BTL Consent NA GBS  Urine positive   VBAC Consent NA Pap Normal 08/08/20       BP Cuff Has a cuff at home Waterbirth  [ ]  Class [ ]  Consent [ ]  CNM visit    Induction  [ ]  Orders Entered [ ] Foley Y/N            History reviewed. No pertinent past medical history.  Past Surgical History:   Procedure Laterality Date   TONSILLECTOMY      Family History  Problem Relation Age of Onset   Diabetes Mother    Diabetes Father    Social History:  reports that she has never smoked. She has never used smokeless tobacco. She reports that she does not drink alcohol and does not use drugs.  Allergies: No Known Allergies  Medications Prior to Admission  Medication Sig Dispense Refill   Prenatal Vit-Fe Fumarate-FA (M-NATAL PLUS) 27-1 MG TABS Take 1 tablet by mouth daily.     Prenatal Vit-Fe Fumarate-FA (PRENATAL COMPLETE) 14-0.4 MG TABS Take 1 tablet by mouth daily. 90 tablet 3   promethazine (PHENERGAN) 25 MG tablet Take 1 tablet (25 mg total) by mouth every 6 (six) hours as needed for nausea or vomiting. (Patient not taking: No sig reported) 90 tablet 1    Results for orders placed or performed during the hospital encounter of 01/23/21 (from the past 48 hour(s))  Type and screen [facility]     Status: None (Preliminary result)   Collection Time: 01/23/21 10:03 PM  Result Value Ref Range   ABO/RH(D) PENDING    Antibody Screen PENDING    Sample Expiration      01/26/2021,2359 Performed at [facility], [address], [place], 01/25/21 01/28/2021   Rapid  HIV screen (HIV 1/2 Ab+Ag) ([facility] Only)     Status: None   Collection Time: 01/23/21 10:03 PM  Result Value Ref Range   HIV-1 P24 Antigen - HIV24 NON REACTIVE NON REACTIVE    Comment: (NOTE) Detection of p24 may be inhibited by biotin in the sample, causing false negative results in acute infection.    HIV 1/2 Antibodies NON REACTIVE NON REACTIVE   Interpretation (HIV Ag Ab)      A non reactive test result means that HIV 1 or HIV 2 antibodies and HIV 1 p24 antigen were not detected in the specimen.    Comment: Performed at [facility], [address] [place], [place] [address]  CBC     Status: Abnormal   Collection Time: 01/23/21 10:03 PM  Result Value Ref Range   WBC 20.8 (H) 4.0  - 10.5 K/uL   RBC 3.71 (L) 3.87 - 5.11 MIL/uL   Hemoglobin 11.3 (L) 12.0 - 15.0 g/dL   HCT 09.9 (L) 83.3 - 82.5 %   MCV 89.2 80.0 - 100.0 fL   MCH 30.5 26.0 - 34.0 pg   MCHC 34.1 30.0 - 36.0 g/dL   RDW 05.3 97.6 - 73.4 %   Platelets 254 150 - 400 K/uL   nRBC 0.0 0.0 - 0.2 %    Comment: Performed at [facility], [address] [place], [place] [address]   [place] MFM FETAL BPP WO NON STRESS  Result Date: 01/23/2021 ----------------------------------------------------------------------  OBSTETRICS REPORT                       (Signed Final 01/23/2021 03:19 pm) ---------------------------------------------------------------------- Patient Info  ID #:       [id number]                          D.O.B.:  1995/04/27 (25 yrs)  Name:       Marissa Reed                    Visit Date: 01/23/2021 01:25 pm ---------------------------------------------------------------------- Performed By  Attending:        Lin Landsman      Ref. Address:     Faculty                    MD  Performed By:     Sandi Mealy        [facility]  Referred By:      Catalina Antigua MD ---------------------------------------------------------------------- Orders  #  Description                           Code        Ordered By  1  [place] MFM FETAL BPP WO NON               16384.66    PAULA DUNCAN     STRESS ----------------------------------------------------------------------  #  Order #                     Accession #                Episode #  1  [id number]                   [id number]                 [id number] ---------------------------------------------------------------------- Indications  [redacted] weeks gestation of pregnancy                Z3A.34  Premature rupture of membranes -  leaking       O42.90  fluid  Obesity complicating pregnancy, second         O99.212  trimester BMI=34  Fetal choroid plexus cyst- resolved            O35.8XX0  Neg AFP; LR NIPS (female) ---------------------------------------------------------------------- Fetal Evaluation  Num Of Fetuses:         1  Fetal Heart Rate(bpm):  162  Cardiac Activity:       Observed  Presentation:           Cephalic  Placenta:               Anterior  P. Cord Insertion:      Previously Visualized  Amniotic Fluid  AFI FV:      Within normal limits  AFI Sum(cm)     %Tile       Largest Pocket(cm)  10.2            20          4.5  RUQ(cm)       RLQ(cm)       LUQ(cm)        LLQ(cm)  4.5           1.3           3              1.4 ---------------------------------------------------------------------- Biophysical Evaluation  Amniotic F.V:   Within normal limits       F. Tone:        Observed  F. Movement:    Observed                   Score:          6/8  F. Breathing:   Not Observed ---------------------------------------------------------------------- OB History  Gravidity:    2         Term:   1  Living:       1 ---------------------------------------------------------------------- Gestational Age  LMP:           34w 5d        Date:  05/25/20  EDD:   03/01/21  Best:          34w 0d     Det. By:  Marcella Dubs         EDD:   03/06/21                                      (08/01/20) ---------------------------------------------------------------------- Impression  Antenatal testing performed given maternal premature  rupture of membranes  The biophysical profile was 6/8 with good fetal movement and  amniotic fluid volume.  Ms. Scibilia had a reactive NST per provider notes therefore  the score is 8/10 which is reassuring. ---------------------------------------------------------------------- Recommendations  Continue daily NST with weekly BPP. ----------------------------------------------------------------------                Lin Landsman, MD Electronically Signed Final Report   01/23/2021 03:19 pm ----------------------------------------------------------------------   Review of Systems  Constitutional:  Negative for chills and fever.  HENT:  Negative for congestion, hearing loss and sinus pain.   Respiratory:  Negative for cough, shortness of breath and wheezing.   Cardiovascular:  Negative for chest pain, palpitations and leg swelling.  Gastrointestinal:  Negative for abdominal pain, constipation, diarrhea, nausea and vomiting.  Genitourinary:  Negative for dysuria, flank pain, frequency, hematuria and urgency.  Musculoskeletal:  Negative for back pain.  Skin:  Negative for rash.  Neurological:  Negative for dizziness and headaches.  Psychiatric/Behavioral:  Negative for suicidal ideas. The patient is not nervous/anxious.    Blood pressure (!) 98/56, pulse 97, temperature 98.3 F (36.8 C), temperature source Oral, resp. rate 16, height 5' 6.14" (1.68 m), weight 104.3 kg, last menstrual period 05/25/2020, not currently breastfeeding. Physical Exam Vitals and nursing note reviewed.  Constitutional:      Appearance: Normal appearance. She is well-developed.  HENT:     Head: Normocephalic and atraumatic.  Cardiovascular:     Rate and Rhythm: Normal rate and regular rhythm.  Pulmonary:     Effort: Pulmonary effort is normal.     Breath sounds: Normal breath sounds.  Abdominal:     General: Bowel sounds are normal.     Palpations: Abdomen is soft.  Musculoskeletal:        General: Normal range of motion.  Skin:    General: Skin is warm and dry.  Neurological:     Mental Status: She is alert and oriented to person, place, and time.  Psychiatric:        Behavior: Behavior normal.        Thought Content: Thought content normal.        Judgment: Judgment normal.    SVE: 3/80/-3 NST: 165 bpm baseline, moderate variability, accelerations present, no decelerations. Tocometer :  irritability  Assessment/Plan 25 y.o. G2P1001 [redacted]w[redacted]d here for  induction of labor secondary to PPROM.  Will begin induction with pitocin. Patient requests to start pitocin after she receives an epidural. Normal fetal monitoring per unit policy Reviewed option for pain management with patient. Patient does desire an epidural.  Normal admission labs GBS: positive Suspect chorioamnionitis given her fetal tachycardia, leukocytosis, and maternal tachycardia. Will start Zosyn. Tylenol as needed for fever.  Adelene Idler MD, [facility], [facility] 01/23/2021 11:27 PM

## 2021-01-23 NOTE — Progress Notes (Signed)
FACULTY PRACTICE ANTEPARTUM COMPREHENSIVE PROGRESS NOTE  Marissa Reed is a 25 y.o. G2P1001 at [redacted]w[redacted]d who is admitted for PPROM.  Estimated Date of Delivery: 03/06/21 Fetal presentation is cephalic.  Length of Stay:  3 Days. Admitted 01/20/2021  Subjective:  Patient reports good fetal movement.  She notes cramping overnight.  Fluid remains clear but sometimes white discharge - no vaginal irritation.  She reports no bleeding, or fever.   Vitals:  Blood pressure (!) 90/54, pulse 96, temperature 98.6 F (37 C), temperature source Oral, resp. rate 16, height 5' 6.14" (1.68 m), weight 104.3 kg, last menstrual period 05/25/2020, SpO2 98 %, not currently breastfeeding. Physical Examination: CONSTITUTIONAL: Well-developed, well-nourished female in no acute distress.  NEUROLOGIC: Alert and oriented to person, place, and time. No cranial nerve deficit noted. PSYCHIATRIC: Normal mood and affect. Normal behavior. Normal judgment and thought content. CARDIOVASCULAR: Normal heart rate noted, regular rhythm RESPIRATORY: Effort and breath sounds normal, no problems with respiration noted MUSCULOSKELETAL: Normal range of motion. No edema and no tenderness. 2+ distal pulses. ABDOMEN: Soft, nontender, nondistended, gravid. CERVIX:  not examined  Fetal monitoring: FHR: 160 bpm, Variability: moderate, Accelerations: Present, Decelerations: Absent  Uterine activity: no contractions per hour    Current scheduled medications  amoxicillin  500 mg Oral TID   docusate sodium  100 mg Oral Daily   prenatal multivitamin  1 tablet Oral Q1200    CBC Latest Ref Rng & Units 01/23/2021 01/20/2021 12/13/2020  WBC 4.0 - 10.5 K/uL 15.9(H) 9.1 7.7  Hemoglobin 12.0 - 15.0 g/dL 11.2(L) 12.0 11.8  Hematocrit 36.0 - 46.0 % 34.2(L) 36.3 35.5  Platelets 150 - 400 K/uL 248 245 258    I have reviewed the patient's current medications.  ASSESSMENT: Active Problems:   Preterm premature rupture of membranes (PPROM) with  unknown onset of labor   PLAN: PPROM -  s/p BMZ 10/17-18 - Latency ABX - She is GBS bactiuria - PCN in labor - NST daily, BPP 10/18 was 8/8, oligo noted but MVP >2. She requests [place] today so we will move up her BPP.  - Last growth 10/3: 46%ile, 1843g, cephalic  - GC/CT negative 10/17 - WBC elevated due to BMZ  Arabic Speaking - Interpreter used throughout the interview    Continue routine antenatal care.   Milas Hock, MD, FACOG Obstetrician & Gynecologist, [facility] for [facility], [facility]

## 2021-01-24 ENCOUNTER — Inpatient Hospital Stay: Payer: 59 | Admitting: Anesthesiology

## 2021-01-24 ENCOUNTER — Encounter: Payer: Self-pay | Admitting: Obstetrics and Gynecology

## 2021-01-24 DIAGNOSIS — O9882 Other maternal infectious and parasitic diseases complicating childbirth: Secondary | ICD-10-CM

## 2021-01-24 DIAGNOSIS — B951 Streptococcus, group B, as the cause of diseases classified elsewhere: Secondary | ICD-10-CM

## 2021-01-24 DIAGNOSIS — Z3A34 34 weeks gestation of pregnancy: Secondary | ICD-10-CM

## 2021-01-24 DIAGNOSIS — O42013 Preterm premature rupture of membranes, onset of labor within 24 hours of rupture, third trimester: Secondary | ICD-10-CM

## 2021-01-24 MED ORDER — ACETAMINOPHEN 500 MG PO TABS
1000.0000 mg | ORAL_TABLET | Freq: Four times a day (QID) | ORAL | Status: DC
  Administered 2021-01-24 – 2021-01-25 (×5): 1000 mg via ORAL
  Filled 2021-01-24 (×6): qty 2

## 2021-01-24 MED ORDER — DOCUSATE SODIUM 100 MG PO CAPS
100.0000 mg | ORAL_CAPSULE | Freq: Two times a day (BID) | ORAL | Status: DC
  Administered 2021-01-25: 100 mg via ORAL
  Filled 2021-01-24 (×2): qty 1

## 2021-01-24 MED ORDER — SIMETHICONE 80 MG PO CHEW: 80.0000 mg | CHEWABLE_TABLET | ORAL | Status: DC | PRN

## 2021-01-24 MED ORDER — ONDANSETRON HCL 4 MG/2ML IJ SOLN: 4.0000 mg | INTRAMUSCULAR | Status: DC | PRN

## 2021-01-24 MED ORDER — SODIUM CHLORIDE 0.9 % IV SOLN
INTRAVENOUS | Status: DC | PRN
  Administered 2021-01-24 (×3): 5 mL via EPIDURAL

## 2021-01-24 MED ORDER — LIDOCAINE HCL (PF) 1 % IJ SOLN
INTRAMUSCULAR | Status: DC | PRN
  Administered 2021-01-24: 2 mL via SUBCUTANEOUS

## 2021-01-24 MED ORDER — PRENATAL MULTIVITAMIN CH
1.0000 | ORAL_TABLET | Freq: Every day | ORAL | Status: DC
  Administered 2021-01-24 – 2021-01-25 (×2): 1 via ORAL
  Filled 2021-01-24 (×2): qty 1

## 2021-01-24 MED ORDER — LIDOCAINE-EPINEPHRINE (PF) 1.5 %-1:200000 IJ SOLN
INTRAMUSCULAR | Status: DC | PRN
  Administered 2021-01-24: 3 mL via EPIDURAL

## 2021-01-24 MED ORDER — COCONUT OIL OIL
1.0000 "application " | TOPICAL_OIL | Status: DC | PRN
  Filled 2021-01-24: qty 120

## 2021-01-24 MED ORDER — BENZOCAINE-MENTHOL 20-0.5 % EX AERO
1.0000 "application " | INHALATION_SPRAY | CUTANEOUS | Status: DC | PRN
  Filled 2021-01-24: qty 56

## 2021-01-24 MED ORDER — DIBUCAINE (PERIANAL) 1 % EX OINT: 1.0000 "application " | TOPICAL_OINTMENT | CUTANEOUS | Status: DC | PRN

## 2021-01-24 MED ORDER — WITCH HAZEL-GLYCERIN EX PADS: 1.0000 "application " | MEDICATED_PAD | CUTANEOUS | Status: DC | PRN

## 2021-01-24 MED ORDER — ONDANSETRON HCL 4 MG PO TABS
4.0000 mg | ORAL_TABLET | ORAL | Status: DC | PRN
  Filled 2021-01-24: qty 1

## 2021-01-24 MED ORDER — ZOLPIDEM TARTRATE 5 MG PO TABS: 5.0000 mg | ORAL_TABLET | Freq: Every evening | ORAL | Status: DC | PRN

## 2021-01-24 MED ORDER — IBUPROFEN 600 MG PO TABS
600.0000 mg | ORAL_TABLET | Freq: Four times a day (QID) | ORAL | Status: DC
  Administered 2021-01-24 – 2021-01-25 (×4): 600 mg via ORAL
  Filled 2021-01-24 (×5): qty 1

## 2021-01-24 MED ORDER — DIPHENHYDRAMINE HCL 25 MG PO CAPS: 25.0000 mg | ORAL_CAPSULE | Freq: Four times a day (QID) | ORAL | Status: DC | PRN

## 2021-01-24 NOTE — Discharge Instructions (Signed)
Discharge Instructions:   Follow-up Appointment: 1 week, please call the office to schedule  If there are any new medications, they have been ordered and will be available for pickup at the listed pharmacy on your way home from the hospital.   Call the office if you have any of the following: headache, visual changes, fever >101.0 F, chills, shortness of breath, breast concerns, excessive vaginal bleeding, incision drainage or problems, leg pain or redness, depression or any other concerns. If you have vaginal discharge with an odor, let your doctor know.   It is normal to bleed for up to 6 weeks. You should not soak through more than 1 pad in 1 hour. If you have a blood clot larger than your fist with continued bleeding, call your doctor.   Activity: Do not lift > 15 lbs for 6 weeks (do not lift anything heavier than your baby). No intercourse, tampons, swimming pools, hot tubs, baths (only showers) for 6 weeks.  No driving for 1-2 weeks. Do not drive while taking narcotic or opioid pain medication.  Continue taking your prenatal vitamin, especially if breastfeeding. Increase calories and fluids (water) while breastfeeding.   Your milk will come in, in the next couple of days (right now it is colostrum). You may have a slight fever when your milk comes in, but it should go away on its own.  If it does not, and rises above 101 F please call the doctor. You will also feel achy and your breasts will be firm. They will also start to leak. If you are breastfeeding, continue as you have been and you can pump/express milk for comfort.   If you have too much milk, your breasts can become engorged, which could lead to mastitis. This is an infection of the milk ducts. It can be very painful and you will need to notify your doctor to obtain a prescription for antibiotics. You can also treat it with a shower or hot/cold compress.   For concerns about your baby, please call your pediatrician.  For  breastfeeding concerns, the lactation consultant can be reached at [phone].   Postpartum blues (feelings of happy one minute and sad another minute) are normal for the first few weeks but if it gets worse let your doctor know.   Congratulations! We enjoyed caring for you and your new bundle of joy!   

## 2021-01-24 NOTE — Discharge Summary (Signed)
Postpartum Discharge Summary    Patient Name: Marissa Reed DOB: 1995-09-29 MRN: [id number]  Date of admission: 01/23/2021 Delivery date:01/24/2021  Delivering provider: Adrian Prows R  Date of discharge: 01/25/2021  Admitting diagnosis: Premature rupture of membranes [O42.90] Intrauterine pregnancy: [redacted]w[redacted]d    Secondary diagnosis:  Active Problems:   Encounter for supervision of normal pregnancy, antepartum   Group B streptococcal bacteriuria   Premature rupture of membranes   [redacted] weeks gestation of pregnancy  Additional problems: none    Discharge diagnosis: Preterm Pregnancy Delivered                                              Post partum procedures: none Augmentation: Pitocin Complications: None  Hospital course: [facility] in [facility]ICU [facility]. Upon arrival she was dilated 3 cm.  She had previously been diagnosed with PPROM.  She was given an epidural and started on pitocin.  Chorioamnionitis was suspected. So, the attending MD started her on Zosyn.  She went on to deliver a viable infant.  She had an uncomplicated postpartum course.  On PPD#1 she was meeting criteria for discharge. She had normal vital signs, normal labs. She was ambulated, tolerating PO, had adequate pain control, her lochia was normal, she was voiding spontaneously.  She desired discharge.  The entire interview was carried out with an Arabic interpreter (Ziad #[phone].  Her infant was still in the NICU at time of discharge.   Magnesium Sulfate received: No BMZ received: Yes Rhophylac:N/A MMR:No T-DaP:Given prenatally on 12/13/2020 Flu: up to date Transfusion:No  Physical exam  Vitals:   01/24/21 1553 01/24/21 1931 01/25/21 0206 01/25/21 0734  BP: (!) 94/55 (!) 91/56 (!) 85/51 101/63  Pulse: 82 73 73 80  Resp: '20 18 14 16  ' Temp: 98 F (36.7 C) 98 F (36.7 C) 98.2 F (36.8 C) 97.6 F (36.4 C)  TempSrc: Oral Oral Oral Oral  SpO2: 99% 100%  100% 99%  Weight:      Height:       General: alert, cooperative, and no distress Lochia: appropriate Uterine Fundus: firm Incision: N/A DVT Evaluation: No evidence of DVT seen on physical exam. No cords or calf tenderness. No significant calf/ankle edema. Labs: Lab Results  Component Value Date   WBC 16.5 (H) 01/25/2021   HGB 12.4 01/25/2021   HCT 37.9 01/25/2021   MCV 90.2 01/25/2021   PLT 271 01/25/2021   No flowsheet data found. Edinburgh Score: Edinburgh Postnatal Depression Scale Screening Tool 01/25/2021  I have been able to laugh and see the funny side of things. 0  I have looked forward with enjoyment to things. 0  I have blamed myself unnecessarily when things went wrong. 0  I have been anxious or worried for no good reason. 0  I have felt scared or panicky for no good reason. 0  Things have been getting on top of me. 0  I have been so unhappy that I have had difficulty sleeping. 0  I have felt sad or miserable. 0  I have been so unhappy that I have been crying. 0  The thought of harming myself has occurred to me. 0  Edinburgh Postnatal Depression Scale Total 0      After visit meds:  Allergies as of 01/25/2021   No Known Allergies  Medication List     STOP taking these medications    promethazine 25 MG tablet Commonly known as: PHENERGAN       TAKE these medications    acetaminophen 500 MG tablet Commonly known as: TYLENOL Take 2 tablets (1,000 mg total) by mouth every 8 (eight) hours as needed for mild pain.   ibuprofen 600 MG tablet Commonly known as: ADVIL Take 1 tablet (600 mg total) by mouth every 8 (eight) hours as needed for mild pain or moderate pain.   Prenatal Complete 14-0.4 MG Tabs Take 1 tablet by mouth daily. What changed: Another medication with the same name was removed. Continue taking this medication, and follow the directions you see here.               Discharge Care Instructions  (From admission, onward)            Start     Ordered   01/25/21 0000  Discharge wound care:       Comments: Perform wound care instructions   01/25/21 1320           Discharge home in stable condition Infant Feeding: Breast and formula Infant Disposition:NICU Discharge instruction: per After Visit Summary and Postpartum booklet. Activity: Advance as tolerated. Pelvic rest for 6 weeks.  Diet: routine diet Anticipated Birth Control: Unsure Postpartum Appointment:2 weeks Additional Postpartum F/U: Postpartum Depression checkup Future Appointments: Future Appointments  Date Time Provider [place]  02/06/2021 10:55 AM Gavin Pound, CNM [facility] None   Follow up Visit:  [place]. Schedule an appointment as soon as possible for a visit in 2 week(s).   Specialty: Obstetrics and Gynecology Contact information: [address] [place] [phone]               SIGNED:  Prentice Docker, MD, [facility], [facility] 01/25/2021 1:26 PM

## 2021-01-24 NOTE — Anesthesia Procedure Notes (Signed)
Epidural Patient location during procedure: OB Start time: 01/24/2021 12:05 AM End time: 01/24/2021 12:30 AM  Staffing Anesthesiologist: Foye Deer, MD Performed: anesthesiologist   Preanesthetic Checklist Completed: patient identified, IV checked, site marked, risks and benefits discussed, surgical consent, monitors and equipment checked, pre-op evaluation and timeout performed  Epidural Patient position: sitting Prep: ChloraPrep Patient monitoring: heart rate, continuous pulse ox and blood pressure Approach: midline Location: L3-L4 Injection technique: LOR air  Needle:  Needle type: Tuohy  Needle gauge: 18 G Needle length: 9 cm Needle insertion depth: 8 cm Catheter type: closed end Catheter size: 20 Guage Catheter at skin depth: 13 cm Test dose: negative and 1.5% lidocaine with Epi 1:200 K  Assessment Events: blood not aspirated  Additional Notes Required 3 redirections in the same interspace before committing to place the catheter paramedian. It was placed right paramedian. Reason for block:procedure for pain

## 2021-01-24 NOTE — Lactation Note (Addendum)
This note was copied from a baby's chart. Parents unable to contact insurance for electric pump for home use, may be able to sign up for [facility] in [place] next week, loaned [employer] breast pump #[id number] until Oct, 28, 2022 so that mom may be able to pump at home until they have obtained their own pump for home use. Rental agreement completed and signed by mom with assist by Arabic interpreter by video and copy sent to specialty coordinator to fax to [facility] foundation for payment. Mom has been pumping breasts every 3 hrs today, approx. 3 cc obtained at last session and taken to SCN and refrigerated.

## 2021-01-24 NOTE — Lactation Note (Signed)
This note was copied from a baby's chart. Mom set up with [employer] electric breast pump and shown how to pump breasts in initiation mode with assist from her husband, instructed to pump breasts every 3 hrs, no colostrum obtained , no drops, parents encouraged and reassured that this is normal, instructed in cleaning pump parts, mom does not have a breast pump at home, first child was on [facility] in [place].  Will discuss possibly obtaining a pump through insurance or WIC when able to use Arabic interpreter.

## 2021-01-25 DIAGNOSIS — Z3A34 34 weeks gestation of pregnancy: Secondary | ICD-10-CM

## 2021-01-25 LAB — CBC
HCT: 37.9 % (ref 36.0–46.0)
Hemoglobin: 12.4 g/dL (ref 12.0–15.0)
MCH: 29.5 pg (ref 26.0–34.0)
MCHC: 32.7 g/dL (ref 30.0–36.0)
MCV: 90.2 fL (ref 80.0–100.0)
Platelets: 271 10*3/uL (ref 150–400)
RBC: 4.2 MIL/uL (ref 3.87–5.11)
RDW: 14 % (ref 11.5–15.5)
WBC: 16.5 10*3/uL — ABNORMAL HIGH (ref 4.0–10.5)
nRBC: 0 % (ref 0.0–0.2)

## 2021-01-25 MED ORDER — ACETAMINOPHEN 500 MG PO TABS: 1000.0000 mg | ORAL_TABLET | Freq: Three times a day (TID) | ORAL | 0 refills | Status: DC | PRN

## 2021-01-25 MED ORDER — IBUPROFEN 600 MG PO TABS: 600.0000 mg | ORAL_TABLET | Freq: Three times a day (TID) | ORAL | 0 refills | Status: DC | PRN

## 2021-01-25 NOTE — Progress Notes (Signed)
Patient discharged, infant remained in SCN. Discharge instructions, prescriptions, and follow up appointments given to and reviewed with patient with the use of an interpreter. Patient verbalized understanding. Will be escorted out by axillary.

## 2021-01-25 NOTE — Anesthesia Postprocedure Evaluation (Signed)
Anesthesia Post Note  Patient: Marissa Reed  Procedure(s) Performed: AN AD HOC LABOR EPIDURAL  Patient location during evaluation: Mother Baby Anesthesia Type: Epidural Level of consciousness: awake and alert Pain management: pain level controlled Vital Signs Assessment: post-procedure vital signs reviewed and stable Respiratory status: spontaneous breathing, nonlabored ventilation and respiratory function stable Cardiovascular status: stable Postop Assessment: no headache, no backache and able to ambulate Anesthetic complications: no   No notable events documented.   Last Vitals:  Vitals:   01/25/21 0206 01/25/21 0734  BP: (!) 85/51 101/63  Pulse: 73 80  Resp: 14 16  Temp: 36.8 C 36.4 C  SpO2: 100% 99%    Last Pain:  Vitals:   01/25/21 0759  TempSrc:   PainSc: 0-No pain                 Foye Deer

## 2021-02-07 ENCOUNTER — Ambulatory Visit: Payer: Self-pay

## 2021-02-07 NOTE — Lactation Note (Signed)
This note was copied from a baby's chart. Lactation Consultation Note  Patient Name: [name] [id number] Date: 02/07/2021 Reason for consult: Follow-up assessment;Late-preterm 34-36.6wks Age:23 wk.o. Arabic interpreter utilized per ipad for this consult Maternal Data    Feeding Mother's Current Feeding Choice: Breast Milk Baby currently nursing on left breast in cradle hold, roots when comes off breast, opens mouth wide and is able to latch self, this was after already nursing on right breast.  Mom reports some tenderness of breasts when baby first latches, then decreases.  Mom shown how to shape breast to get deeper latch, she states pain is increased, especially on right, when pumping, she states she has suction turned up, I recommended that she decrease suction when pumping to just when she feels tugging, if there is pain, decrease suction.  I also gave her some sample Lanolin packets to use in pump flange when pumping and also after nursing, no need to wipe off breast, safe for baby.                  LATCH Score Latch: Repeated attempts needed to sustain latch, nipple held in mouth throughout feeding, stimulation needed to elicit sucking reflex.  Audible Swallowing: Spontaneous and intermittent  Type of Nipple: Everted at rest and after stimulation  Comfort (Breast/Nipple): Filling, red/small blisters or bruises, mild/mod discomfort  Hold (Positioning): No assistance needed to correctly position infant at breast.  LATCH Score: 9   Lactation Tools Discussed/Used  Mom has concerns about how much baby is getting at breast, I reassured her that she had just breastfed baby a few times, the more baby practiced, he would assist with milk production and be more efficient and that the neo and nurses would instruct her before discharge as to how much baby would need to be supplemented.      Interventions Interventions:  (mom given lanolin to use with pumping and after breastfeeding with  instructions in use)  Discharge    Consult Status Consult Status: PRN    Dyann Kief 02/07/2021, 6:01 PM

## 2021-02-21 IMAGING — US US MFM OB COMP + 14 WK
1 series · 14 of 28 positions shown · non-contrast
Comparison: none

[Series 1: us mfm ob comp + 14 wk · 87 acquisitions, 14 frames shown]
[im 4/87]
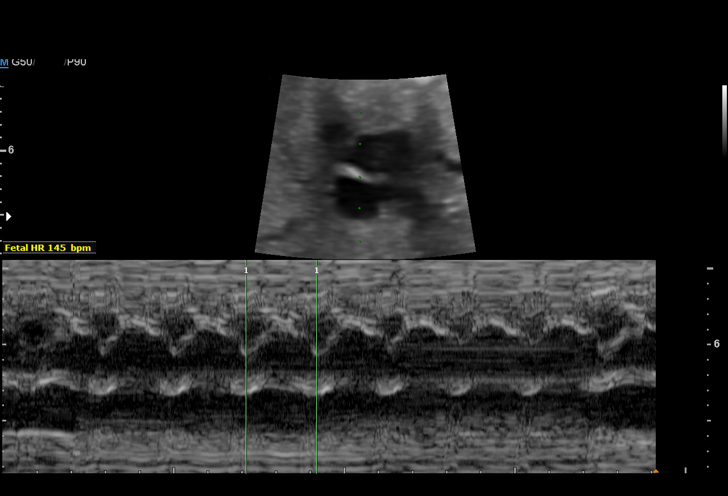
[im 10/87]
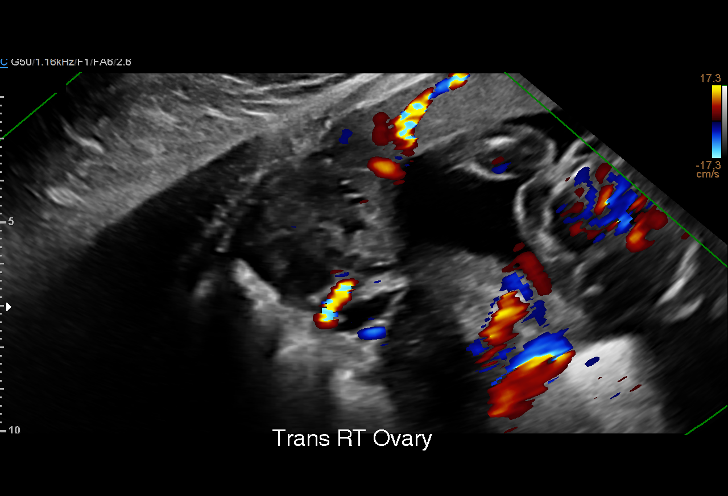
[im 16/87]
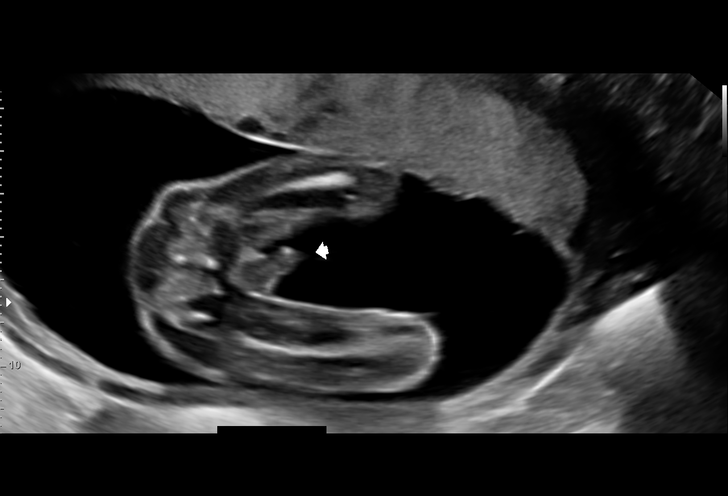
[im 23/87]
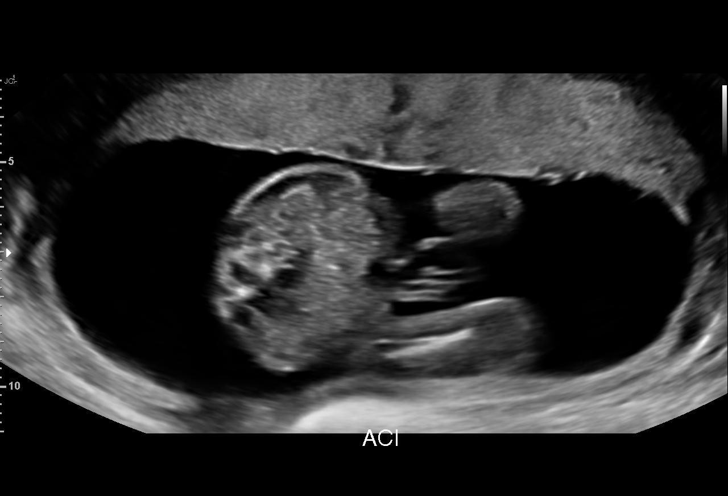
[im 29/87]
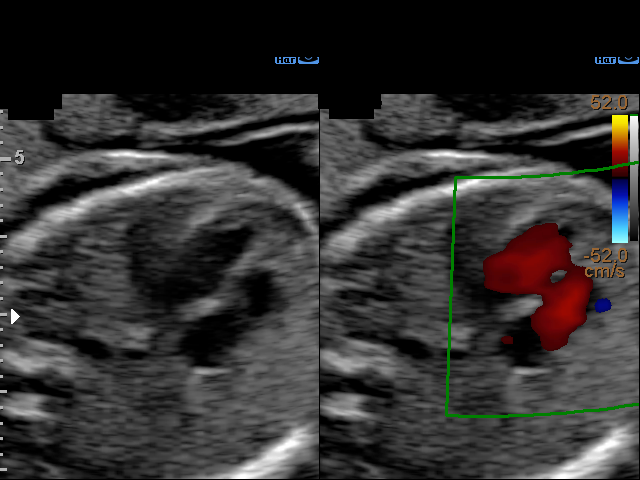
[im 36/87]
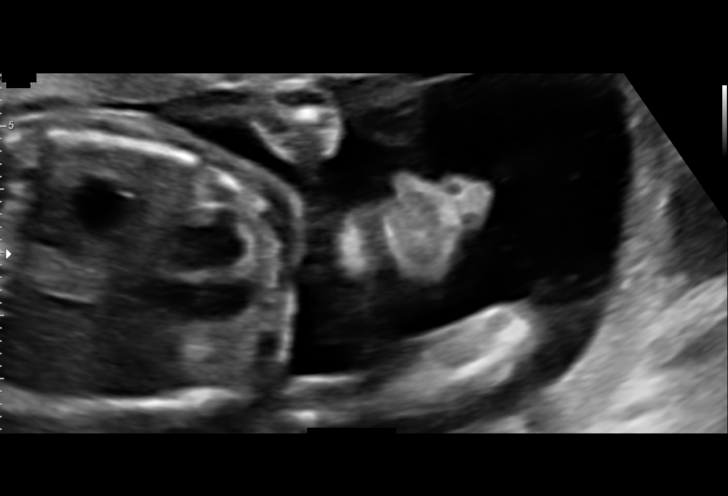
[im 42/87]
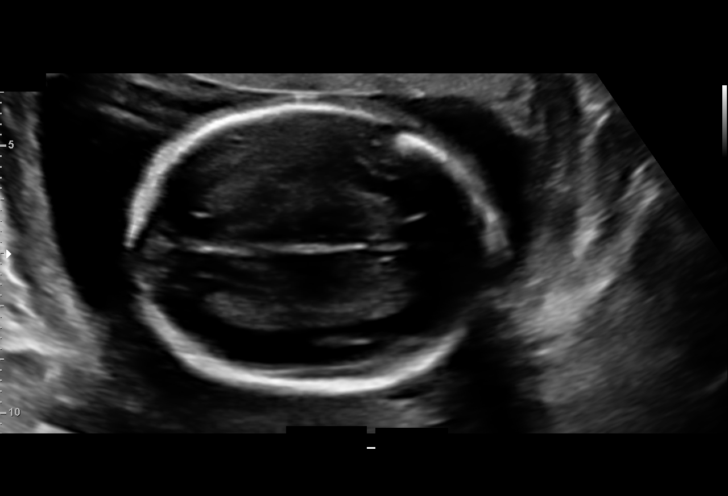
[im 48/87]
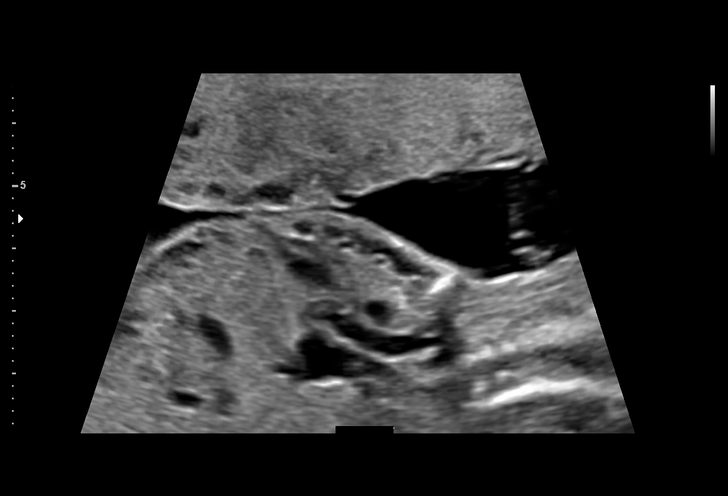
[im 55/87]
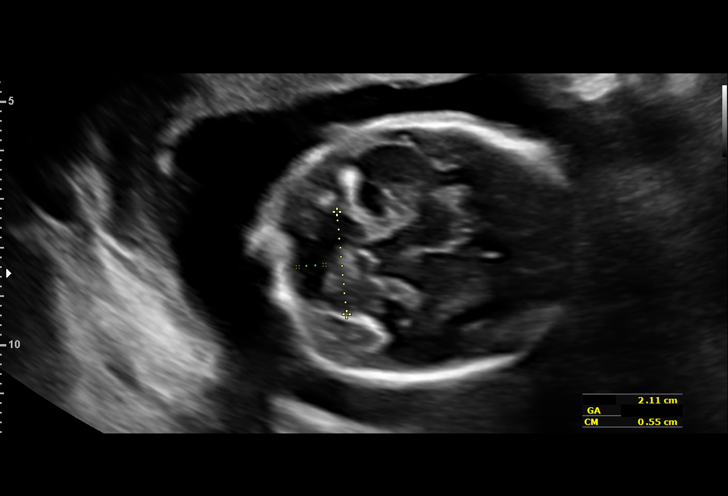
[im 61/87]
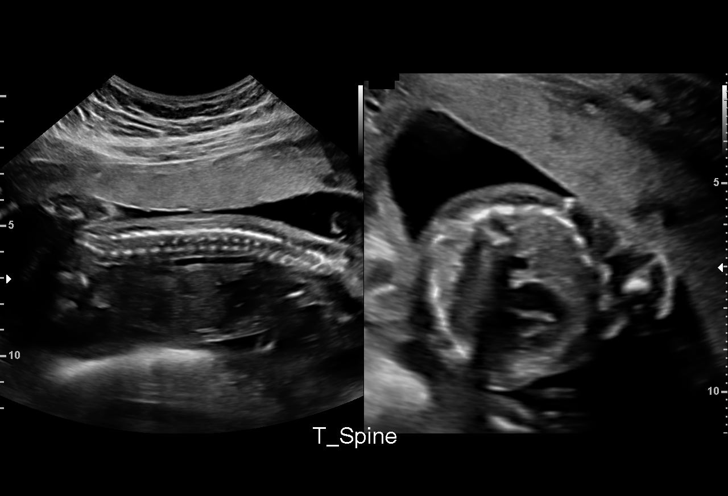
[im 67/87]
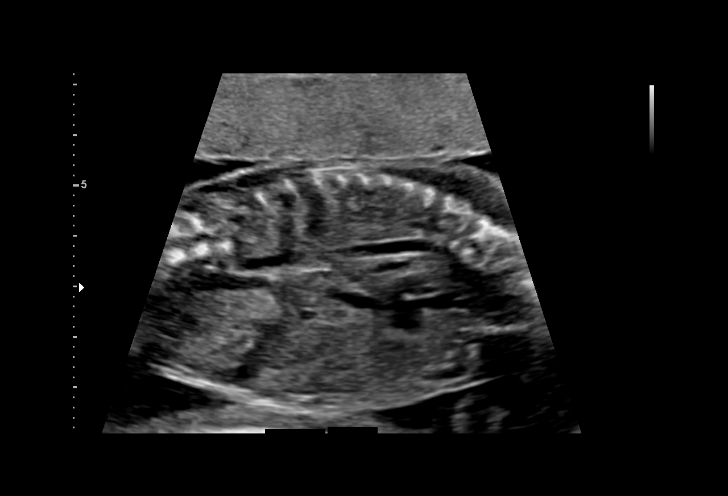
[im 74/87]
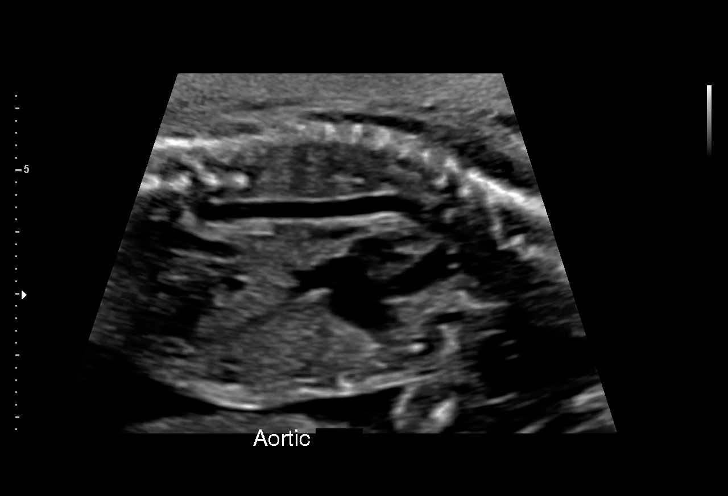
[im 80/87]
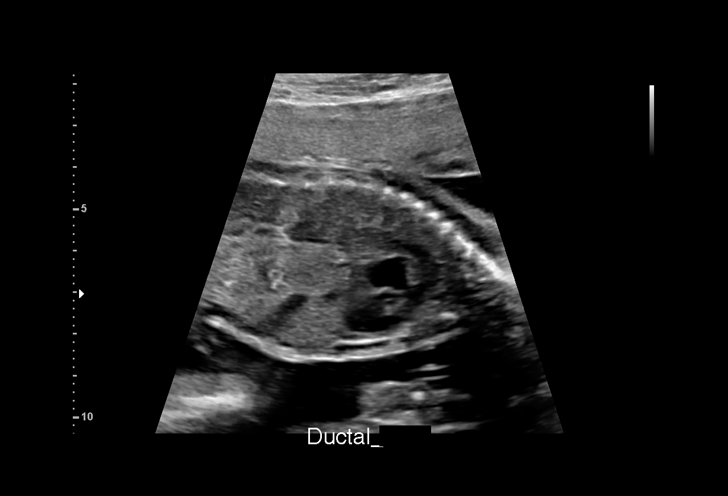
[im 87/87]
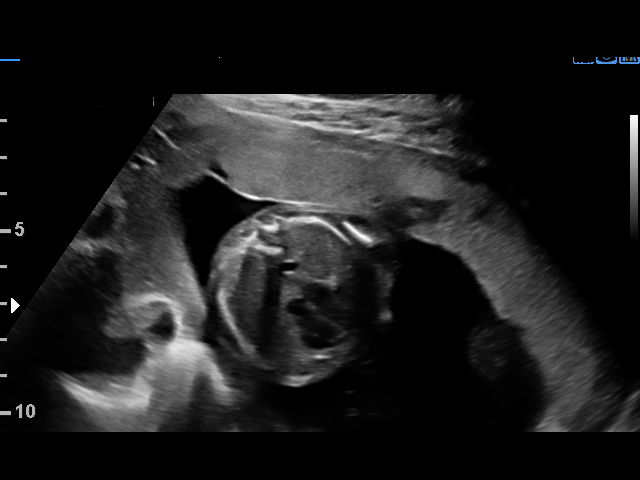

[14 of 28 positions shown; findings below may reference images not displayed]

----------------------------------------------------------------------

 ----------------------------------------------------------------------
Indications

  Encounter for antenatal screening for
  malformations (Low Risk NIPS)
  21 weeks gestation of pregnancy
 ----------------------------------------------------------------------
Vital Signs

 BMI:
Fetal Evaluation

 Num Of Fetuses:         1
 Fetal Heart Rate(bpm):  145
 Cardiac Activity:       Observed
 Presentation:           Cephalic
 Placenta:               Anterior
 P. Cord Insertion:      Visualized

 Amniotic Fluid
 AFI FV:      Within normal limits

                             Largest Pocket(cm)

Biometry

 BPD:      52.3  mm     G. Age:  21w 6d         54  %    CI:        73.12   %    70 - 86
                                                         FL/HC:      20.2   %    18.4 -
 HC:      194.4  mm     G. Age:  21w 5d         36  %    HC/AC:      1.20        1.06 -
 AC:      161.6  mm     G. Age:  21w 2d         28  %    FL/BPD:     75.0   %    71 - 87
 FL:       39.2  mm     G. Age:  22w 4d         70  %    FL/AC:      24.3   %    20 - 24
 HUM:      36.1  mm     G. Age:  22w 4d         69  %
 CER:      21.2  mm     G. Age:  20w 1d         13  %
 NFT:       4.6  mm

 LV:        4.6  mm
 CM:        4.4  mm

 Est. FW:     455  gm           1 lb     51  %
OB History

 Gravidity:    1
 Living:       0
Gestational Age

 LMP:           21w 5d        Date:  05/26/18                 EDD:   03/02/19
 U/S Today:     21w 6d                                        EDD:   03/01/19
 Best:          21w 5d     Det. By:  LMP  (05/26/18)          EDD:   03/02/19
Anatomy

 Cranium:               Appears normal         Aortic Arch:            Not well visualized
 Cavum:                 Appears normal         Ductal Arch:            Not well visualized
 Ventricles:            Appears normal         Diaphragm:              Appears normal
 Choroid Plexus:        Appears normal         Stomach:                Appears normal, left
                                                                       sided
 Cerebellum:            Appears normal         Abdomen:                Appears normal
 Posterior Fossa:       Appears normal         Abdominal Wall:         Appears nml (cord
                                                                       insert, abd wall)
 Nuchal Fold:           Not applicable (>20    Cord Vessels:           Appears normal (3
                        wks GA)                                        vessel cord)
 Face:                  Orbits appear          Kidneys:                Appear normal
                        normal
 Lips:                  Appears normal         Bladder:                Appears normal
 Thoracic:              Appears normal         Spine:                  Appears normal
 Heart:                 Appears normal         Upper Extremities:      Appears normal
                        (4CH, axis, and
                        situs)
 RVOT:                  Not well visualized    Lower Extremities:      Appears normal
 LVOT:                  Appears normal

 Other:  Male gender. Technically difficult due to fetal position.
Cervix Uterus Adnexa

 Cervix
 Length:            3.2  cm.
 Normal appearance by transabdominal scan.

 Uterus
 No abnormality visualized.

 Left Ovary
 Within normal limits.

 Right Ovary
 Within normal limits.

 Cul De Sac
 No free fluid seen.
 Adnexa
 No abnormality visualized. No adnexal mass
 visualized. No free fluid.
Impression

 Normal interval growth.  No ultrasonic evidence of structural
 fetal anomalies.
 Suboptimal views of the fetal anatomy was obtained
 secondary to fetal position.
Recommendations

 Follow up anatomy in 4 weeks.

## 2021-02-24 ENCOUNTER — Ambulatory Visit: Payer: 59 | Admitting: Advanced Practice Midwife

## 2021-03-29 IMAGING — US US MFM OB FOLLOW UP
1 series · 13 of 28 positions shown · non-contrast
Comparison: none

[Series 1: us mfm ob follow up · 57 acquisitions, 13 frames shown]
[im 3/57]
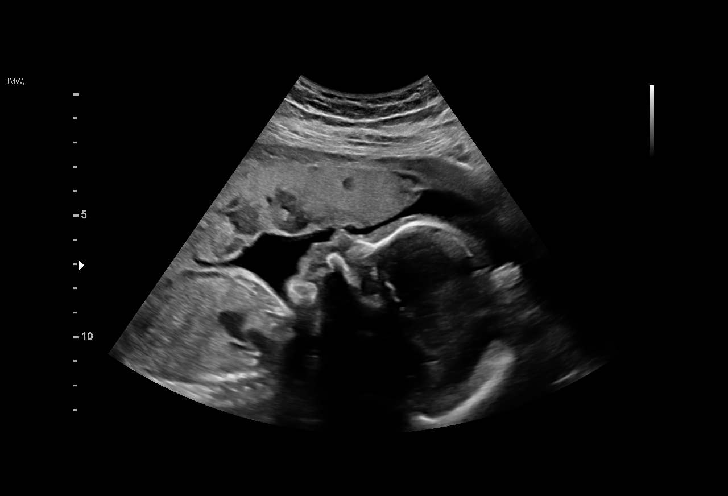
[im 7/57]
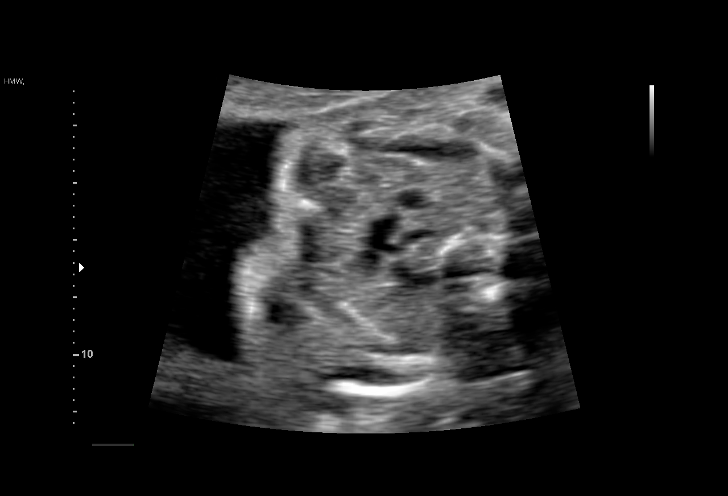
[im 11/57]
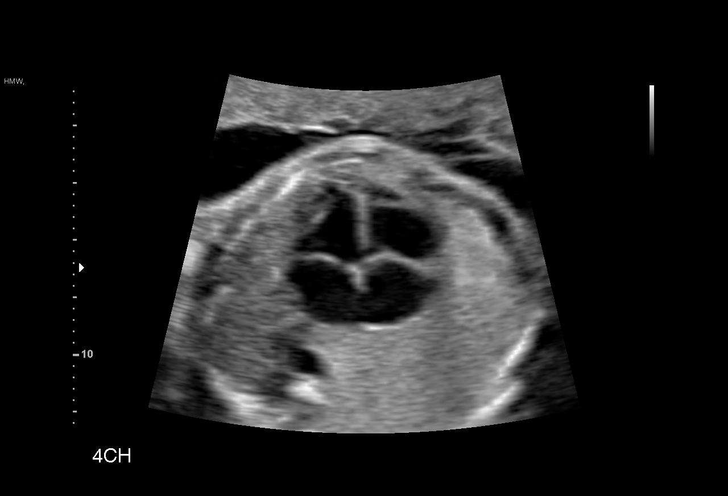
[im 15/57]
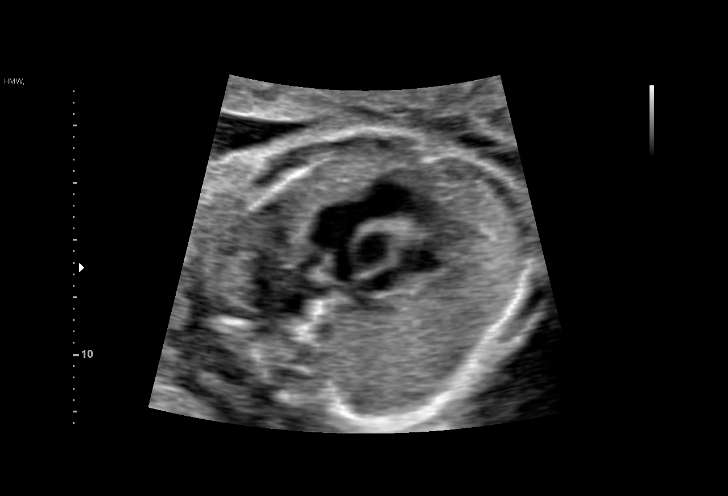
[im 19/57]
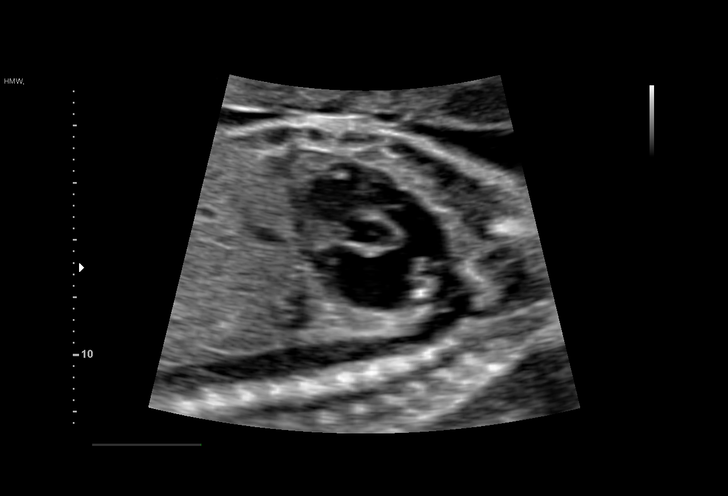
[im 23/57]
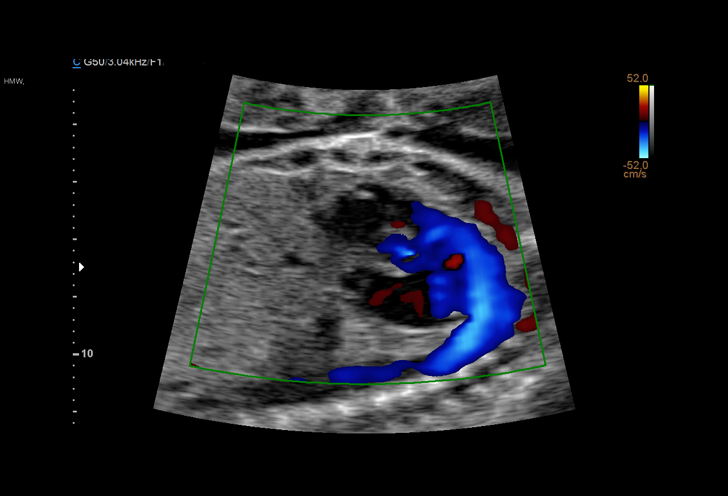
[im 30/57]
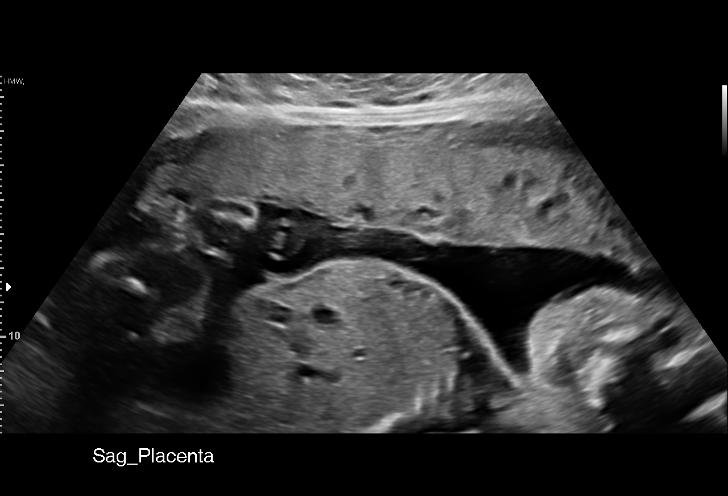
[im 34/57]
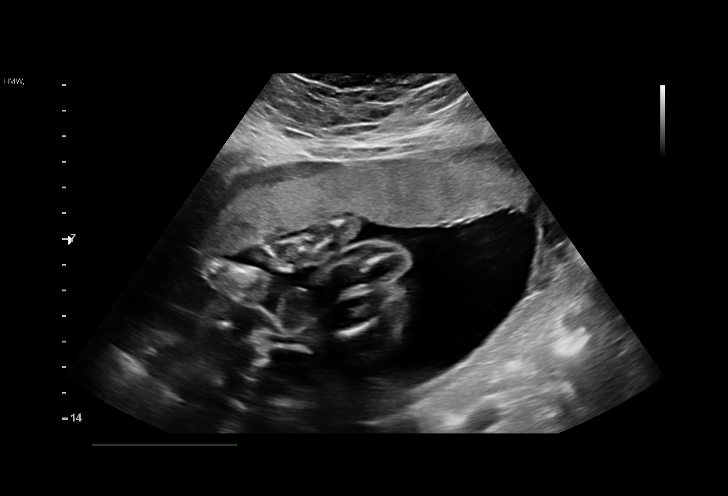
[im 38/57]
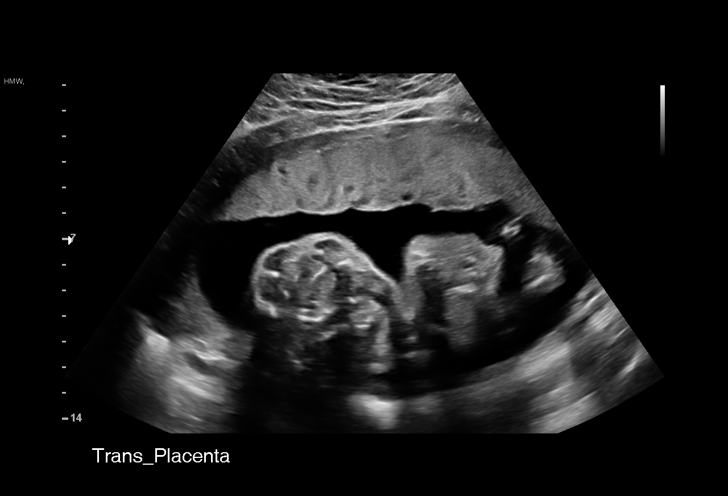
[im 42/57]
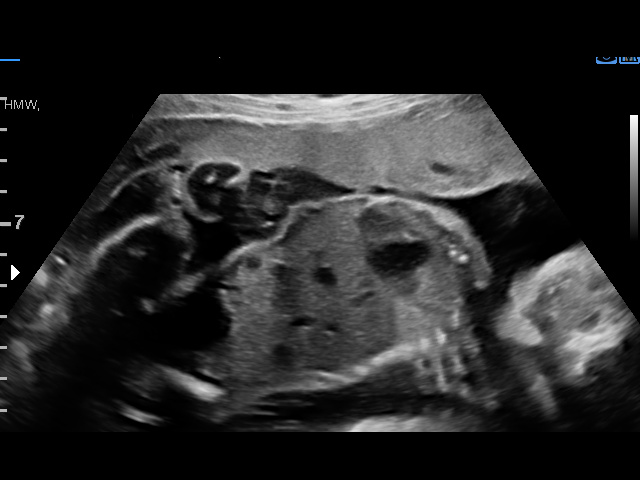
[im 46/57]
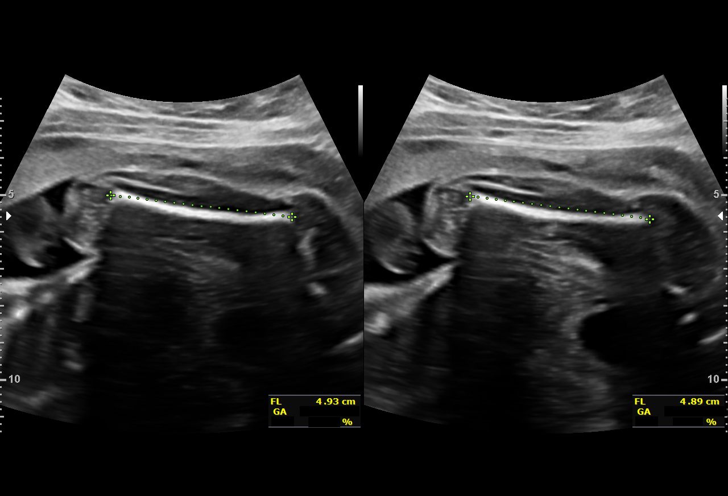
[im 50/57]
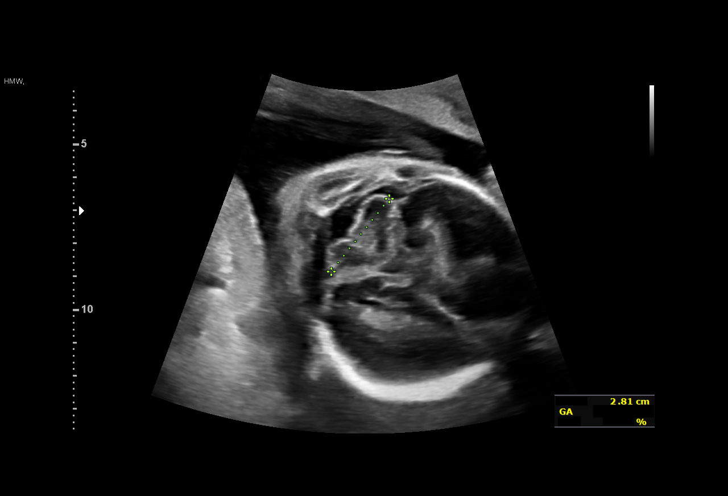
[im 54/57]
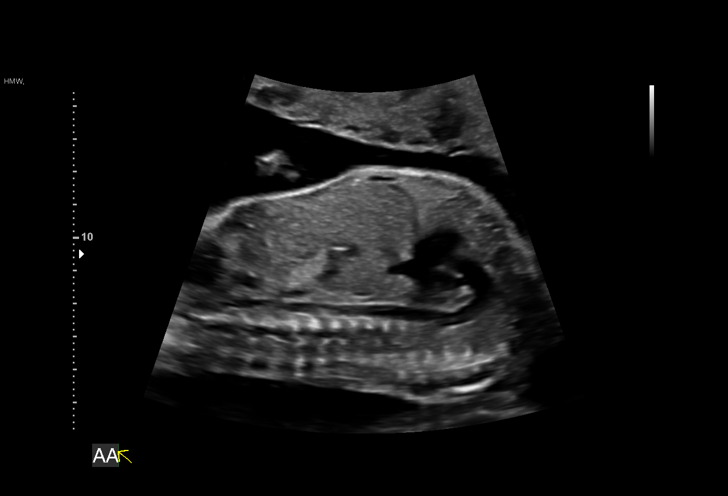

[13 of 28 positions shown; findings below may reference images not displayed]

POLAT
 ----------------------------------------------------------------------

 ----------------------------------------------------------------------
Indications

  26 weeks gestation of pregnancy
  Encounter for other antenatal screening
  follow-up (low risk NIPS)
 ----------------------------------------------------------------------
Vital Signs

                                                Height:        5'8"
Fetal Evaluation

 Num Of Fetuses:          1
 Fetal Heart Rate(bpm):   145
 Cardiac Activity:        Observed
 Presentation:            Cephalic
 Placenta:                Anterior
 P. Cord Insertion:       Visualized, central

 Amniotic Fluid
 AFI FV:      Within normal limits

                             Largest Pocket(cm)

Biometry

 BPD:      69.4  mm     G. Age:  27w 6d         74  %    CI:        74.86   %    70 - 86
                                                         FL/HC:       19.3  %    18.6 -
 HC:      254.5  mm     G. Age:  27w 5d         49  %    HC/AC:       1.17       1.05 -
 AC:      217.2  mm     G. Age:  26w 1d         22  %    FL/BPD:      70.7  %    71 - 87
 FL:       49.1  mm     G. Age:  26w 4d         26  %    FL/AC:       22.6  %    20 - 24
 HUM:      44.5  mm     G. Age:  26w 3d         36  %
 CER:      28.1  mm     G. Age:  25w 1d         20  %
 Est. FW:     951   gm     2 lb 2 oz     26  %
OB History

 Gravidity:    1
 Living:       0
Gestational Age

 LMP:           26w 6d        Date:  05/26/18                 EDD:   03/02/19
 U/S Today:     27w 1d                                        EDD:   02/28/19
 Best:          26w 6d     Det. By:  LMP  (05/26/18)          EDD:   03/02/19
Anatomy

 Cranium:               Appears normal         Aortic Arch:            Appears normal
 Cavum:                 Previously seen        Ductal Arch:            Appears normal
 Ventricles:            Appears normal         Diaphragm:              Previously seen
 Choroid Plexus:        Previously seen        Stomach:                Appears normal, left
                                                                       sided
 Cerebellum:            Previously seen        Abdomen:                Previously seen
 Posterior Fossa:       Previously seen        Abdominal Wall:         Previously seen
 Nuchal Fold:           Not applicable (>20    Cord Vessels:           Previously seen
                        wks GA)
 Face:                  Profile nl; orbits     Kidneys:                Appear normal
                        prev vis
 Lips:                  Previously seen        Bladder:                Appears normal
 Thoracic:              Appears normal         Spine:                  Previously seen
 Heart:                 Appears normal         Upper Extremities:      Previously seen
                        (4CH, axis, and
                        situs)
 RVOT:                  Appears normal         Lower Extremities:      Previously seen
 LVOT:                  Appears normal

 Other:  Male gender. Technically difficult due to fetal position.
Cervix Uterus Adnexa

 Cervix
 Not visualized (advanced GA >74wks)

 Uterus
 No abnormality visualized.

 Left Ovary
 No adnexal mass visualized.

 Right Ovary
 No adnexal mass visualized.

 Cul De Sac
 No free fluid seen.

 Adnexa
 No abnormality visualized.
Impression

 Patient returned for completion of fetal anatomy. Fetal growth
 is appropriate for gestational age. Amniotic fluid is normal
 and good fetal activity is seen. Fetal anatomical survey was
 completed and appears normal.
Recommendations

 Follow-up scans as clinically indicated.
                 Albert, Marketa

## 2021-04-08 ENCOUNTER — Ambulatory Visit: Payer: 59 | Admitting: Nurse Practitioner

## 2021-05-29 IMAGING — US US MFM OB FOLLOW-UP
1 series · 14 of 28 positions shown · non-contrast
Comparison: none

[Series 1: us mfm ob follow-up · 32 acquisitions, 14 frames shown]
[im 2/32]
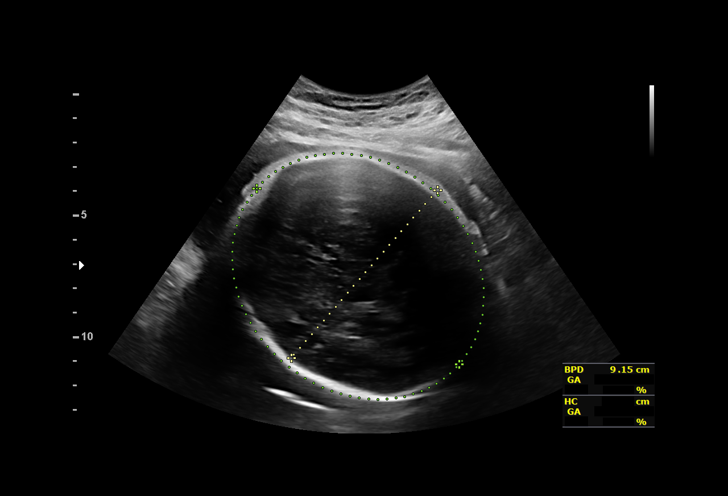
[im 4/32]
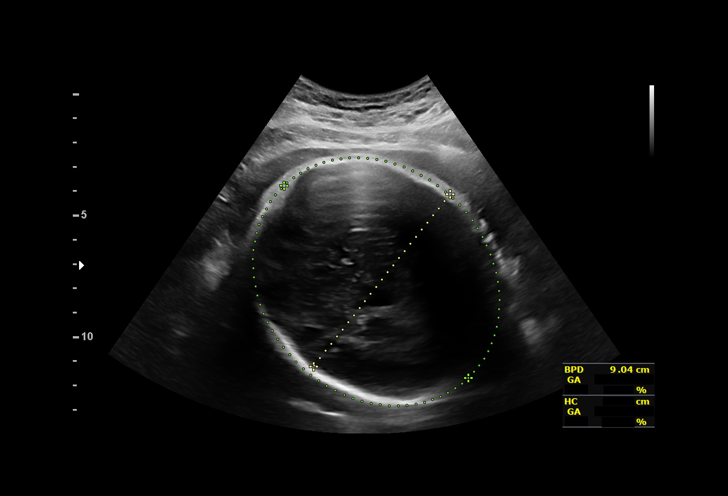
[im 6/32]
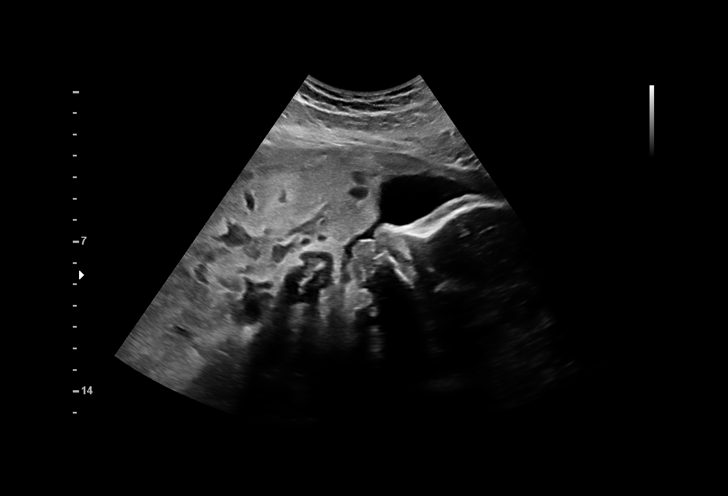
[im 9/32]
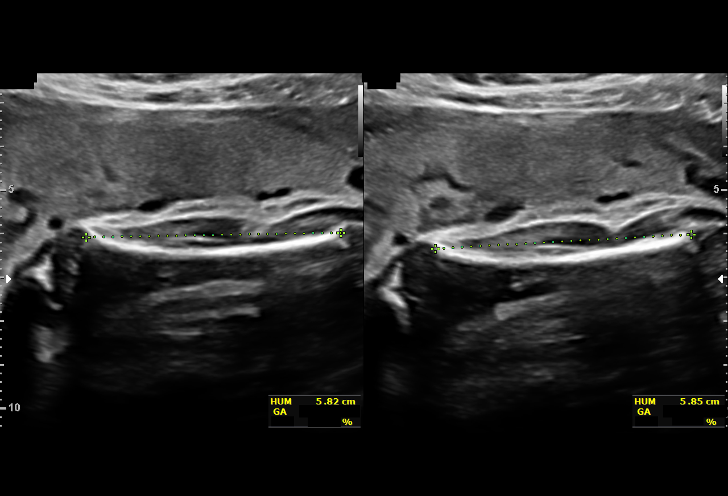
[im 11/32]
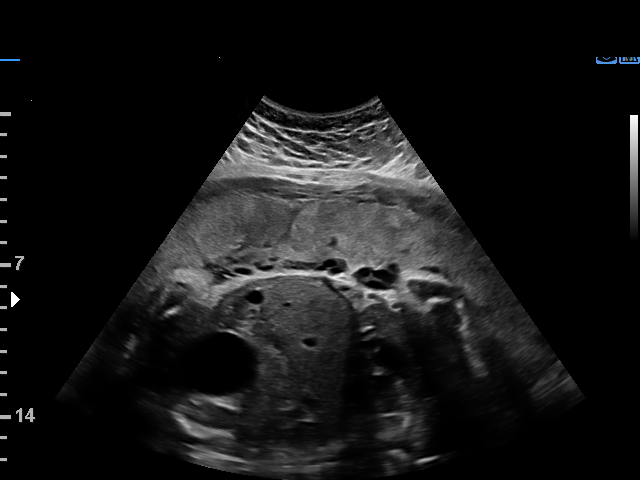
[im 13/32]
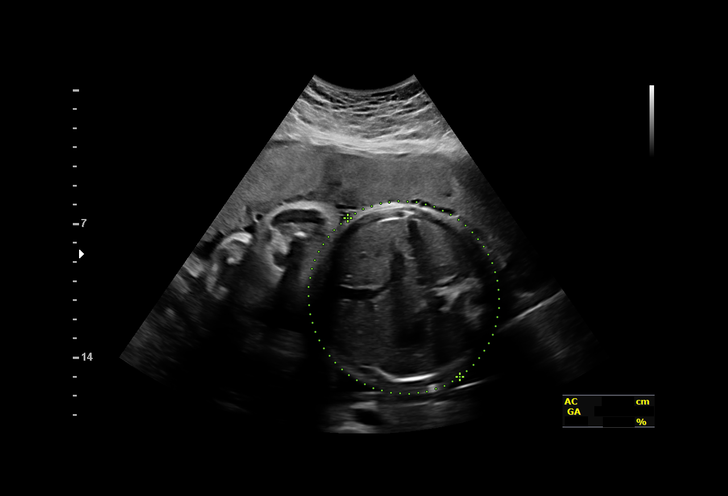
[im 15/32]
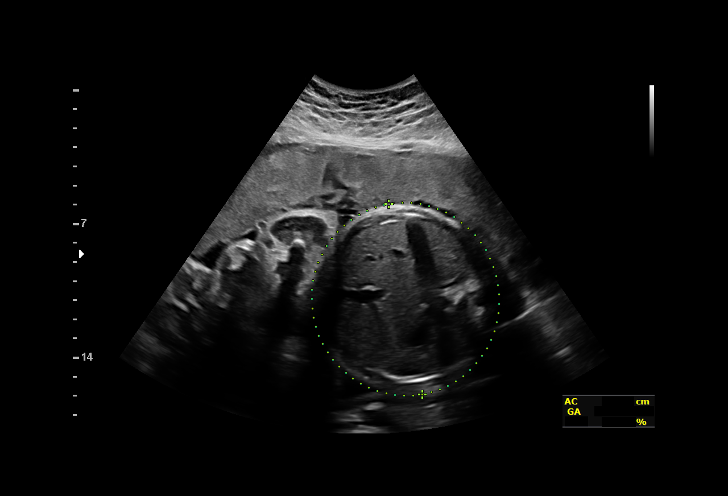
[im 18/32]
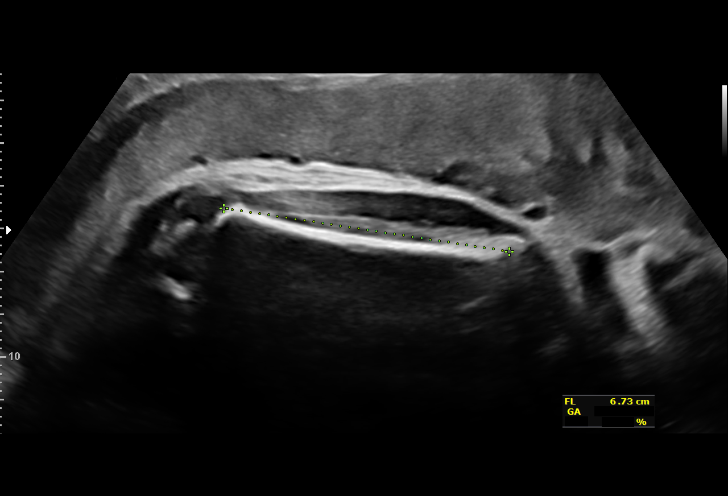
[im 20/32]
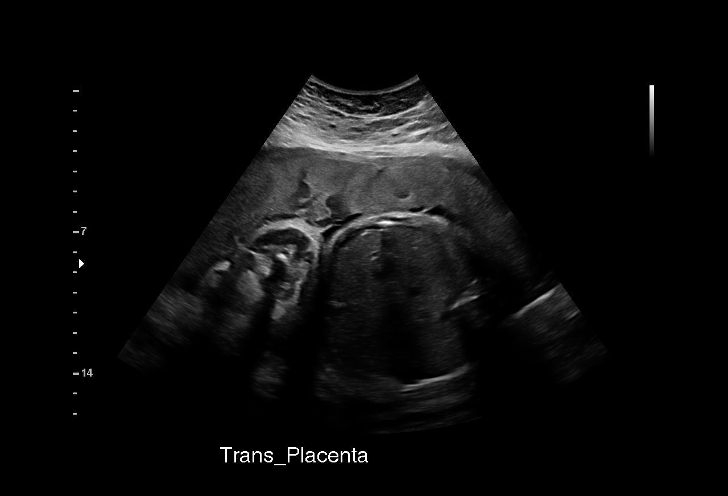
[im 22/32]
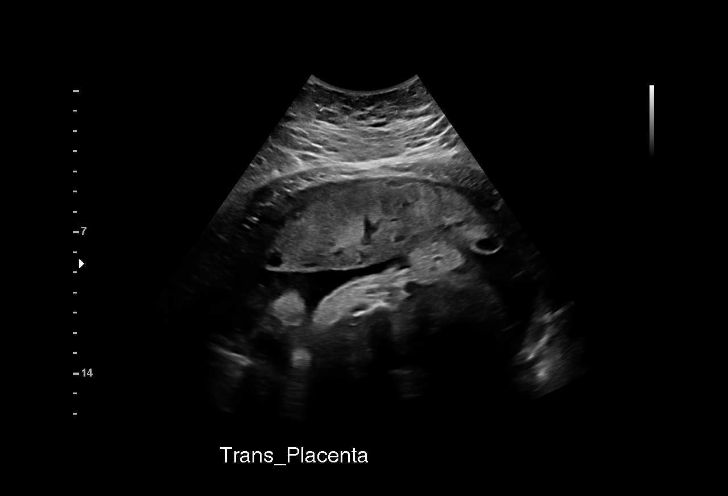
[im 25/32]
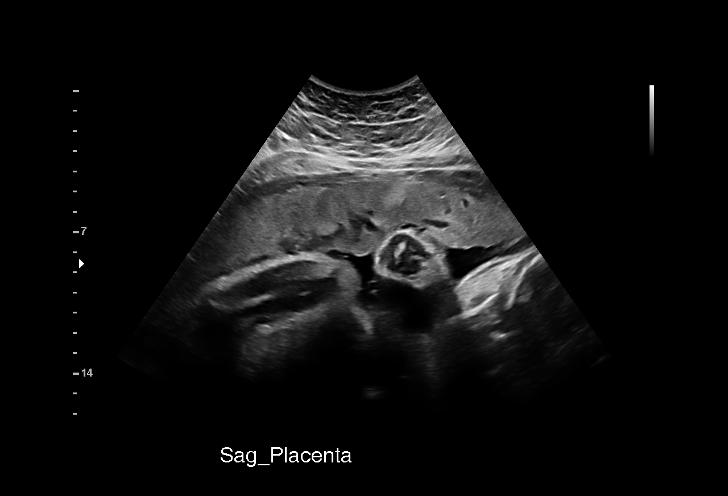
[im 27/32]
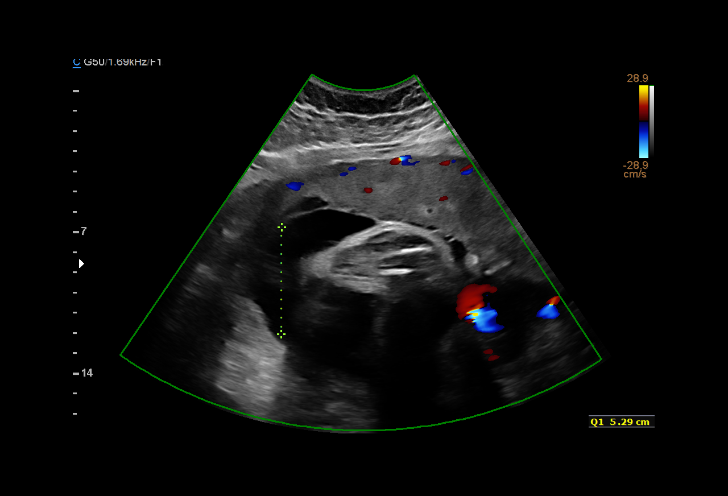
[im 29/32]
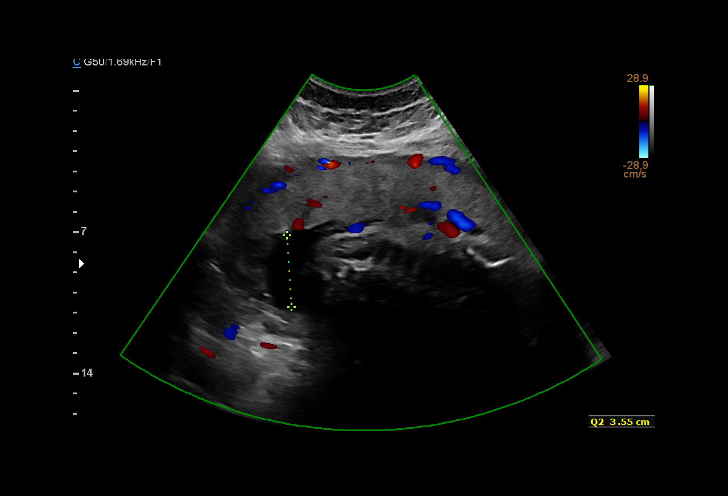
[im 32/32]
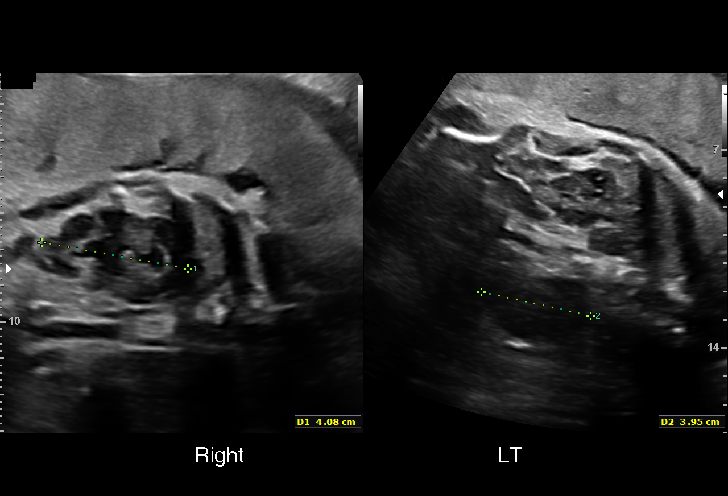

[14 of 28 positions shown; findings below may reference images not displayed]

----------------------------------------------------------------------

 ----------------------------------------------------------------------
Indications

  Fetal abnormality - other known or suspected
  Encounter for other antenatal screening
  follow-up (low risk NIPS)
  35 weeks gestation of pregnancy
 ----------------------------------------------------------------------
Vital Signs

                                                Height:        5'8"
Fetal Evaluation

 Num Of Fetuses:         1
 Fetal Heart Rate(bpm):  142
 Cardiac Activity:       Observed
 Presentation:           Cephalic
 Placenta:               Anterior
 P. Cord Insertion:      Previously Visualized

 Amniotic Fluid
 AFI FV:      Within normal limits

 AFI Sum(cm)     %Tile       Largest Pocket(cm)
 16.05           59

 RUQ(cm)       RLQ(cm)       LUQ(cm)        LLQ(cm)

Biometry

 BPD:      90.8  mm     G. Age:  36w 6d         86  %    CI:        80.52   %    70 - 86
                                                         FL/HC:      21.1   %    20.1 -
 HC:      319.6  mm     G. Age:  36w 0d         28  %    HC/AC:      1.02        0.93 -
 AC:      314.6  mm     G. Age:  35w 3d         53  %    FL/BPD:     74.1   %    71 - 87
 FL:       67.3  mm     G. Age:  34w 4d         21  %    FL/AC:      21.4   %    20 - 24
 HUM:      58.4  mm     G. Age:  33w 6d         33  %

 Est. FW:    0770  gm    5 lb 14 oz      44  %
OB History

 Gravidity:    1
 Living:       0
Gestational Age

 LMP:           35w 4d        Date:  05/26/18                 EDD:   03/02/19
 U/S Today:     35w 5d                                        EDD:   03/01/19
 Best:          35w 4d     Det. By:  LMP  (05/26/18)          EDD:   03/02/19
Anatomy

 Cranium:               Appears normal         Aortic Arch:            Previously seen
 Cavum:                 Previously seen        Ductal Arch:            Previously seen
 Ventricles:            Previously seen        Diaphragm:              Appears normal
 Choroid Plexus:        Previously seen        Stomach:                Appears normal, left
                                                                       sided
 Cerebellum:            Previously seen        Abdomen:                Previously seen
 Posterior Fossa:       Previously seen        Abdominal Wall:         Previously seen
 Nuchal Fold:           Not applicable (>20    Cord Vessels:           Previously seen
                        wks GA)
 Face:                  Orbits and profile     Kidneys:                Appear normal
                        previously seen
 Lips:                  Previously seen        Bladder:                Appears normal
 Thoracic:              Appears normal         Spine:                  Previously seen
 Heart:                 Previously seen        Upper Extremities:      Previously seen
 RVOT:                  Previously seen        Lower Extremities:      Previously seen
 LVOT:                  Previously seen

 Other:  Male gender prev seen. Technically difficult due to fetal position.
Cervix Uterus Adnexa

 Cervix
 Not visualized (advanced GA >21wks)
Comments

 This patient was seen for a follow up growth scan.  She
 denies any problems since her last exam and denies any
 pregnancy.
 She was informed that the fetal growth and amniotic fluid
 level appears appropriate for her gestational age.
 Follow-up as indicated.

## 2023-03-25 ENCOUNTER — Encounter (HOSPITAL_COMMUNITY): Payer: Self-pay

## 2023-03-25 ENCOUNTER — Ambulatory Visit (HOSPITAL_COMMUNITY)
Admission: EM | Admit: 2023-03-25 | Discharge: 2023-03-25 | Disposition: A | Discharge status: Home / Self Care | Payer: Medicaid Other | Attending: Internal Medicine | Admitting: Internal Medicine

## 2023-03-25 DIAGNOSIS — Z3201 Encounter for pregnancy test, result positive: Secondary | ICD-10-CM

## 2023-03-25 LAB — POCT URINE PREGNANCY: Preg Test, Ur: POSITIVE — AB

## 2023-03-25 NOTE — ED Triage Notes (Signed)
Per interpreter, pt requesting pregnancy test. States had a positive home pregnancy test. Last [facility] 11/23. C/o nausea.

## 2023-03-25 NOTE — ED Provider Notes (Signed)
[facility]    CSN: [id number] Arrival date & time: 03/25/23  1433      History   Chief Complaint Chief Complaint  Patient presents with   Possible Pregnancy    HPI Marissa Reed is a 27 y.o. female.  Medical interpretor used for encounter Patient had a positive home pregnancy test and would like to confirm. LMP 11/23 No abdominal pain, nausea, vomiting, bleeding or spotting  She needs confirmation of result for medicaid  History reviewed. No pertinent past medical history.  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   Language barrier affecting health care 08/08/2018    Past Surgical History:  Procedure Laterality Date   TONSILLECTOMY      OB History     Gravida  3   Para  2   Term  1   Preterm  1   AB      Living  2      SAB      IAB      Ectopic      Multiple  0   Live Births  2            Home Medications    Prior to Admission medications   Not on File    Family History Family History  Problem Relation Age of Onset   Diabetes Mother    Diabetes Father     Social History Social History   Tobacco Use   Smoking status: Never   Smokeless tobacco: Never  Vaping Use   Vaping status: Never Used  Substance Use Topics   Alcohol use: Never   Drug use: Never     Allergies   Patient has no known allergies.   Review of Systems Review of Systems Per HPI  Physical Exam Triage Vital Signs ED Triage Vitals  [03/25/23 1607]  Encounter Vitals Group     BP 132/75     Systolic BP Percentile      Diastolic BP Percentile      Pulse Rate 95     Resp 18     Temp 98.5 F (36.9 C)     Temp Source Oral     SpO2 98 %     Weight      Height      Head Circumference      Peak Flow      Pain Score 0     Pain Loc      Pain Education      Exclude from Growth Chart    No data found.  Updated Vital Signs BP 132/75 (BP Location: Left Arm)   Pulse 95   Temp 98.5 F (36.9 C) (Oral)   Resp 18   LMP 02/27/2023 (Exact Date)   SpO2 98%   Breastfeeding No    Physical Exam Vitals and nursing note reviewed.  Constitutional:      General: She is not in acute distress.    Appearance: Normal appearance.  Cardiovascular:     Rate and Rhythm: Normal  rate and regular rhythm.     Heart sounds: Normal heart sounds.  Pulmonary:     Effort: Pulmonary effort is normal.     Breath sounds: Normal breath sounds.  Abdominal:     Palpations: Abdomen is soft.     Tenderness: There is no abdominal tenderness.  Neurological:     Mental Status: She is alert and oriented to person, place, and time.     UC Treatments / Results  Labs (all labs ordered are listed, but only abnormal results are displayed) Labs Reviewed  POCT URINE PREGNANCY - Abnormal; Notable for the following components:      Result Value   Preg Test, Ur Positive (*)    All other components within normal limits    EKG  Radiology No results found.  Procedures Procedures \  Medications Ordered in UC Medications - No data to display  Initial Impression / Assessment and Plan / UC Course  I have reviewed the triage vital signs and the nursing notes.  Pertinent labs & imaging results that were available during my care of the patient were reviewed by me and considered in my medical decision making (see chart for details).  UPT positive Provided printed copy of result Advised call ob/gyn to make follow up appointment Discussed MAU  precautions Patient without questions at this time  Final Clinical Impressions(s) / UC Diagnoses   Final diagnoses:  Positive pregnancy test     Discharge Instructions      Call the ob/gyn today to make follow up appointment [facility]     ED Prescriptions   None    PDMP not reviewed this encounter.   Merin Borjon, Lurena Joiner, [place] 03/25/23 1709

## 2023-03-25 NOTE — Discharge Instructions (Signed)
Call the ob/gyn today to make follow up appointment [facility] with any concerns

## 2023-04-07 NOTE — L&D Delivery Note (Signed)
 OB/GYN Faculty Practice Delivery Note  Marissa Reed is a 28 y.o. [id number] s/p SVD at [redacted]w[redacted]d. She was admitted for SROM.   ROM: 25h 26m with clear fluid GBS Status: negative Maximum Maternal Temperature: 98.6  Labor Progress: Patient presented after SROM. She was augmented with pitocin . Due to protracted labor at 6 cm an IUPC was placed. Epidural was in place for pain management. Pitocin  titrated until contractions were adequate. She progressed to complete with an uneventful 2nd stage of labor.  Delivery Date/Time: 11/25/23 1219am Delivery: Called to room and patient was complete and pushing. Head delivered left occiput anterior. nuchal cord absent. Shoulder and body delivered in usual fashion. Infant with spontaneous cry, placed on mother's abdomen, dried and stimulated. Cord clamped x 2 after 1-minute delay, and cut by father. Cord blood drawn. Placenta delivered spontaneously with gentle cord traction. Fundus firm with massage and Pitocin . Labia, perineum, vagina, and cervix inspected inspected with 1st degree perineal laceration which was repaired for hemostasis.   Placenta:  spontaneous Intact Complications:none Lacerations: 1st degree EBL: Analgesia: Epidural  Newborn Data: Birth date:11/25/2023 Birth time:12:19 AM Gender:Female Living status:  Apgars: ,  Weight:            Leeroy Pouch, MD OB Family Medicine Fellow, [facility] for [facility], [facility] 11/25/2023, 12:52 AM

## 2023-04-14 ENCOUNTER — Inpatient Hospital Stay (HOSPITAL_COMMUNITY)
Admission: AD | Admit: 2023-04-14 | Discharge: 2023-04-14 | Disposition: A | Discharge status: Home / Self Care | Payer: Medicaid Other | Attending: Obstetrics & Gynecology | Admitting: Obstetrics & Gynecology

## 2023-04-14 ENCOUNTER — Encounter (HOSPITAL_COMMUNITY): Payer: Self-pay | Admitting: *Deleted

## 2023-04-14 ENCOUNTER — Inpatient Hospital Stay (HOSPITAL_COMMUNITY): Payer: Medicaid Other

## 2023-04-14 DIAGNOSIS — O26891 Other specified pregnancy related conditions, first trimester: Secondary | ICD-10-CM | POA: Insufficient documentation

## 2023-04-14 DIAGNOSIS — O219 Vomiting of pregnancy, unspecified: Secondary | ICD-10-CM | POA: Diagnosis not present

## 2023-04-14 DIAGNOSIS — O3481 Maternal care for other abnormalities of pelvic organs, first trimester: Secondary | ICD-10-CM | POA: Insufficient documentation

## 2023-04-14 DIAGNOSIS — N8312 Corpus luteum cyst of left ovary: Secondary | ICD-10-CM | POA: Diagnosis not present

## 2023-04-14 DIAGNOSIS — R109 Unspecified abdominal pain: Secondary | ICD-10-CM | POA: Diagnosis not present

## 2023-04-14 DIAGNOSIS — Z3A01 Less than 8 weeks gestation of pregnancy: Secondary | ICD-10-CM | POA: Insufficient documentation

## 2023-04-14 DIAGNOSIS — Z3490 Encounter for supervision of normal pregnancy, unspecified, unspecified trimester: Secondary | ICD-10-CM

## 2023-04-14 DIAGNOSIS — R1032 Left lower quadrant pain: Secondary | ICD-10-CM | POA: Insufficient documentation

## 2023-04-14 DIAGNOSIS — O26899 Other specified pregnancy related conditions, unspecified trimester: Secondary | ICD-10-CM

## 2023-04-14 LAB — URINALYSIS, ROUTINE W REFLEX MICROSCOPIC
Bilirubin Urine: NEGATIVE
Glucose, UA: NEGATIVE mg/dL
Hgb urine dipstick: NEGATIVE
Ketones, ur: NEGATIVE mg/dL
Leukocytes,Ua: NEGATIVE
Nitrite: NEGATIVE
Protein, ur: NEGATIVE mg/dL
Specific Gravity, Urine: 1.023 (ref 1.005–1.030)
pH: 6 (ref 5.0–8.0)

## 2023-04-14 LAB — COMPREHENSIVE METABOLIC PANEL
ALT: 14 U/L (ref 0–44)
AST: 18 U/L (ref 15–41)
Albumin: 3.1 g/dL — ABNORMAL LOW (ref 3.5–5.0)
Alkaline Phosphatase: 60 U/L (ref 38–126)
Anion gap: 10 (ref 5–15)
BUN: 12 mg/dL (ref 6–20)
CO2: 22 mmol/L (ref 22–32)
Calcium: 8.9 mg/dL (ref 8.9–10.3)
Chloride: 103 mmol/L (ref 98–111)
Creatinine, Ser: 0.66 mg/dL (ref 0.44–1.00)
GFR, Estimated: >60 mL/min (ref >60)
Glucose, Bld: 97 mg/dL (ref 70–99)
Potassium: 3.3 mmol/L — ABNORMAL LOW (ref 3.5–5.1)
Sodium: 135 mmol/L (ref 135–145)
Total Bilirubin: 0.3 mg/dL (ref 0.0–1.2)
Total Protein: 6.8 g/dL (ref 6.5–8.1)

## 2023-04-14 LAB — WET PREP, GENITAL
Clue Cells Wet Prep HPF POC: NONE SEEN
Sperm: NONE SEEN
Trich, Wet Prep: NONE SEEN
WBC, Wet Prep HPF POC: >=10 — AB (ref <10)
Yeast Wet Prep HPF POC: NONE SEEN

## 2023-04-14 LAB — CBC
HCT: 37.5 % (ref 36.0–46.0)
Hemoglobin: 12.5 g/dL (ref 12.0–15.0)
MCH: 29.5 pg (ref 26.0–34.0)
MCHC: 33.3 g/dL (ref 30.0–36.0)
MCV: 88.4 fL (ref 80.0–100.0)
Platelets: 354 10*3/uL (ref 150–400)
RBC: 4.24 MIL/uL (ref 3.87–5.11)
RDW: 14.3 % (ref 11.5–15.5)
WBC: 6.6 10*3/uL (ref 4.0–10.5)
nRBC: 0 % (ref 0.0–0.2)

## 2023-04-14 MED ORDER — ONDANSETRON 4 MG PO TBDP: 4.0000 mg | ORAL_TABLET | Freq: Three times a day (TID) | ORAL | 0 refills | Status: DC | PRN

## 2023-04-14 NOTE — Discharge Instructions (Signed)
 Safe Medications in Pregnancy   Acne: Benzoyl Peroxide Salicylic Acid  Backache/Headache: Tylenol: 2 regular strength every 4 hours OR              2 Extra strength every 6 hours  Colds/Coughs/Allergies: Benadryl (alcohol free) 25 mg every 6 hours as needed Breath right strips Claritin Cepacol throat lozenges Chloraseptic throat spray Cold-Eeze- up to three times per day Cough drops, alcohol free Flonase (by prescription only) Guaifenesin Mucinex Robitussin DM (plain only, alcohol free) Saline nasal spray/drops Sudafed (pseudoephedrine) & Actifed ** use only after [redacted] weeks gestation and if you do not have high blood pressure Tylenol Vicks Vaporub Zinc lozenges Zyrtec   Constipation: Colace Ducolax suppositories Fleet enema Glycerin suppositories Metamucil Milk of magnesia Miralax Senokot Smooth move tea  Diarrhea: Kaopectate Imodium A-D  *NO pepto Bismol  Hemorrhoids: Anusol Anusol HC Preparation H Tucks  Indigestion: Tums Maalox Mylanta Cimetidine (Tagamet HB)** preferred in pregnancy Famotidine (Pepcid) Ranitidine (Zantac)  Insomnia: Benadryl (alcohol free) 25mg  every 6 hours as needed Tylenol PM Unisom, no Gelcaps  Leg Cramps: Tums MagGel  Nausea/Vomiting:  Bonine Dramamine Emetrol Ginger extract Sea bands Meclizine   Nausea medication to take during pregnancy:  Unisom (doxylamine succinate 25 mg tablets) Take one tablet daily at bedtime. If symptoms are not adequately controlled, the dose can be increased to a maximum recommended dose of two tablets daily (1/2 tablet in the morning, 1/2 tablet mid-afternoon and one at bedtime). Vitamin B6 100mg  tablets. Take one tablet twice a day (up to 200 mg per day).  Skin Rashes: Aveeno products Benadryl cream or 25mg  every 6 hours as needed Calamine Lotion 1% cortisone cream  Yeast infection: Gyne-lotrimin 7 Monistat 7   **If taking multiple medications, please check labels to avoid  duplicating the same active ingredients **take medication as directed on the label ** Do not exceed 4000 mg of tylenol in 24 hours **Do not take medications that contain aspirin or ibuprofen

## 2023-04-14 NOTE — MAU Note (Signed)
..  Marissa Reed is a 28 y.o. at [redacted]w[redacted]d here in MAU reporting: lower left sided abdominal pain/cramping that began this morning that comes and goes.  Has not taken anything for the pain.  Denies vaginal bleeding.   Pain score: 6/10 Vitals:   04/14/23 1924  BP: 106/63  Pulse: 95  Resp: 16  Temp: 97.8 F (36.6 C)  SpO2: 100%     FHT:n/a

## 2023-04-14 NOTE — MAU Provider Note (Addendum)
  History     CSN: [id number]  Arrival date and time: 04/14/23 1907   Event Date/Time   First Provider Initiated Contact with Patient    Chief Complaint  Patient presents with   Abdominal Pain    HPI  Marissa Reed is a 28 y.o. [id number] at [redacted]w[redacted]d who presents to the MAU for abdominal pain. Reports lower left sided abdominal pain which started this morning, similar to period cramps. Pain is intermittent in nature. Has not taken any meds for pain. No VB, urinary symptoms, c/d. Endorses nausea with vomiting in AM, doesn't have meds.  No past medical history on file.  Past Surgical History:  Procedure Laterality Date   TONSILLECTOMY      Family History  Problem Relation Age of Onset   Diabetes Mother    Diabetes Father     Social History   Tobacco Use   Smoking status: Never   Smokeless tobacco: Never  Vaping Use   Vaping status: Never Used  Substance Use Topics   Alcohol use: Never   Drug use: Never    Allergies: No Known Allergies  No medications prior to admission.    ROS reviewed and pertinent positives and negatives as documented in HPI.  Physical Exam   Blood pressure 106/63, pulse 95, temperature 97.8 F (36.6 C), temperature source Oral, resp. rate 16, height 5' 6 (1.676 m), weight 97.5 kg, last menstrual period 02/27/2023, SpO2 100%.  Physical Exam Constitutional:      General: She is not in acute distress.    Appearance: Normal appearance.  HENT:     Head: Normocephalic and atraumatic.  Cardiovascular:     Rate and Rhythm: Normal rate and regular rhythm.     Heart sounds: Normal heart sounds.  Pulmonary:     Effort: Pulmonary effort is normal. No respiratory distress.     Breath sounds: Normal breath sounds.  Abdominal:     General: There is no distension.     Palpations: Abdomen is soft.     Tenderness: There is no abdominal tenderness. There is no right CVA tenderness or left CVA tenderness.  Musculoskeletal:        General: Normal range of  motion.  Skin:    General: Skin is warm and dry.     Findings: No rash.  Neurological:     General: No focal deficit present.     Mental Status: She is alert and oriented to person, place, and time.     MAU Course  Procedures  MDM 27 y.o. H6E8897 at [redacted]w[redacted]d presenting for left sided abdominal pain and cramping in setting of pregnancy of unknown location. Pt is well appearing and abdominal exam is benign. On labs, she is Rh positive,  HgB is 12.5, wet prep is negative, and UA is not suggestive of UTI. On U/S, SIUP with +FCA, a subchorionic hemorrhage is noted, and left corpus luteum cyst. Suspect pain is secondary to corpus luteum cyst in setting of otherwise reassuring workup. Recommend tylenol , heat, rest. Pt given strict return precautions.   Assessment and Plan  Abdominal pain affecting pregnancy Suspect 2/2 corpus luteum cyst on left SIUP noted Swabs negative  Intrauterine pregnancy F/up with primary OB Return precautions reviewed Rx Zofran  for nausea prn  Alain Sor, MD OB Fellow, Faculty Practice [facility], Center for [facility]  04/14/2023, 9:24 PM

## 2023-04-15 LAB — GC/CHLAMYDIA PROBE AMP (~~LOC~~) NOT AT ARMC
Chlamydia: NEGATIVE
Comment: NEGATIVE
Comment: NORMAL
Neisseria Gonorrhea: NEGATIVE

## 2023-04-22 ENCOUNTER — Ambulatory Visit: Payer: Medicaid Other | Admitting: *Deleted

## 2023-04-22 ENCOUNTER — Other Ambulatory Visit (INDEPENDENT_AMBULATORY_CARE_PROVIDER_SITE_OTHER): Payer: Medicaid Other

## 2023-04-22 VITALS — BP 103/70 | HR 78 | Wt 215.7 lb

## 2023-04-22 DIAGNOSIS — O0991 Supervision of high risk pregnancy, unspecified, first trimester: Secondary | ICD-10-CM

## 2023-04-22 DIAGNOSIS — O0990 Supervision of high risk pregnancy, unspecified, unspecified trimester: Secondary | ICD-10-CM

## 2023-04-22 DIAGNOSIS — Z1339 Encounter for screening examination for other mental health and behavioral disorders: Secondary | ICD-10-CM | POA: Diagnosis not present

## 2023-04-22 DIAGNOSIS — Z3A08 8 weeks gestation of pregnancy: Secondary | ICD-10-CM

## 2023-04-22 DIAGNOSIS — O09899 Supervision of other high risk pregnancies, unspecified trimester: Secondary | ICD-10-CM | POA: Insufficient documentation

## 2023-04-22 NOTE — Progress Notes (Signed)
New OB Intake  In Person Arabic interpreter used for entire visit.   I connected with Marissa Reed  on 04/22/23 at  1:10 PM EST by In Person Visit and verified that I am speaking with the correct person using two identifiers. Nurse is located at [facility] and pt is located at [employer].  I discussed the limitations, risks, security and privacy concerns of performing an evaluation and management service by telephone and the availability of in person appointments. I also discussed with the patient that there may be a patient responsible charge related to this service. The patient expressed understanding and agreed to proceed.  I explained I am completing New OB Intake today. We discussed EDD of 12/04/2023, by Last Menstrual Period. Pt is G3P1102. I reviewed her allergies, medications and Medical/Surgical/OB history.    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   Language barrier affecting health care 08/08/2018    Concerns addressed today  Delivery Plans Plans to deliver at [facility]. Discussed the nature of our practice with multiple providers including residents and students. Due to the size of the practice, the delivering provider may not be the same as those providing prenatal care.   Patient is not interested in water birth. Offered upcoming OB visit with CNM to discuss further.  MyChart/Babyscripts MyChart access verified. I explained pt will have some visits in office and some virtually.  Babyscripts instructions given and order placed. Patient verifies receipt of registration text/e-mail. Account successfully created and app downloaded. If patient is a candidate for Optimized scheduling, add to sticky note.   Blood Pressure Cuff/Weight Scale Blood pressure cuff ordered for patient to pick-up from [employer]. Explained after first prenatal appt pt will check weekly and document in Babyscripts. Patient does not have weight scale; patient may purchase if they desire to track weight weekly in Babyscripts.  Anatomy US Explained first scheduled [place] will be around 19 weeks. Anatomy US scheduled for TBD at TBD.  Interested in [place]? If yes, send referral and doula dot phrase.   Is patient a candidate for Babyscripts Optimization? No, due to [employer], Risk Factors   First visit review I reviewed new OB appt with patient. Explained pt will be seen by Dr. Marice Potter at first visit. Discussed Avelina Laine genetic screening with patient. Requests Panorama and Horizon.. Routine prenatal labs  OB Urine only collected at today's visit. Initial OB labs, Natera deferred to New OB appt.    Last Pap Diagnosis  Date Value Ref Range Status  08/08/2020   Final   - Negative for intraepithelial lesion or malignancy (NILM)    Harrel Lemon, RN 04/22/2023  1:50 PM

## 2023-04-22 NOTE — Patient Instructions (Signed)

## 2023-04-24 LAB — URINE CULTURE, OB REFLEX

## 2023-04-24 LAB — CULTURE, OB URINE

## 2023-04-29 ENCOUNTER — Telehealth: Payer: Self-pay

## 2023-04-29 NOTE — Telephone Encounter (Signed)
Returned call with Arabic interpreter, no answer, left vm

## 2023-05-10 ENCOUNTER — Encounter: Payer: Self-pay | Admitting: Obstetrics & Gynecology

## 2023-05-10 ENCOUNTER — Ambulatory Visit (INDEPENDENT_AMBULATORY_CARE_PROVIDER_SITE_OTHER): Payer: Medicaid Other | Admitting: Obstetrics & Gynecology

## 2023-05-10 VITALS — BP 106/67 | HR 97 | Wt 213.3 lb

## 2023-05-10 DIAGNOSIS — Z3491 Encounter for supervision of normal pregnancy, unspecified, first trimester: Secondary | ICD-10-CM

## 2023-05-10 DIAGNOSIS — Z8759 Personal history of other complications of pregnancy, childbirth and the puerperium: Secondary | ICD-10-CM | POA: Insufficient documentation

## 2023-05-10 DIAGNOSIS — O99210 Obesity complicating pregnancy, unspecified trimester: Secondary | ICD-10-CM | POA: Insufficient documentation

## 2023-05-10 DIAGNOSIS — Z758 Other problems related to medical facilities and other health care: Secondary | ICD-10-CM

## 2023-05-10 DIAGNOSIS — Z603 Acculturation difficulty: Secondary | ICD-10-CM | POA: Diagnosis not present

## 2023-05-10 DIAGNOSIS — Z3A10 10 weeks gestation of pregnancy: Secondary | ICD-10-CM

## 2023-05-10 DIAGNOSIS — O09899 Supervision of other high risk pregnancies, unspecified trimester: Secondary | ICD-10-CM

## 2023-05-10 DIAGNOSIS — Z3481 Encounter for supervision of other normal pregnancy, first trimester: Secondary | ICD-10-CM

## 2023-05-10 DIAGNOSIS — Z1339 Encounter for screening examination for other mental health and behavioral disorders: Secondary | ICD-10-CM

## 2023-05-10 DIAGNOSIS — Z3401 Encounter for supervision of normal first pregnancy, first trimester: Secondary | ICD-10-CM

## 2023-05-10 DIAGNOSIS — O09891 Supervision of other high risk pregnancies, first trimester: Secondary | ICD-10-CM

## 2023-05-10 DIAGNOSIS — O99211 Obesity complicating pregnancy, first trimester: Secondary | ICD-10-CM

## 2023-05-10 MED ORDER — ONDANSETRON HCL 8 MG PO TABS: 8.0000 mg | ORAL_TABLET | Freq: Three times a day (TID) | ORAL | 3 refills | Status: DC | PRN

## 2023-05-10 MED ORDER — ASPIRIN 81 MG PO CHEW: 81.0000 mg | CHEWABLE_TABLET | Freq: Every day | ORAL | 3 refills | Status: DC

## 2023-05-10 NOTE — Addendum Note (Signed)
Addended by: Gavin Potters on: 05/10/2023 11:55 AM   Modules accepted: Orders

## 2023-05-10 NOTE — Progress Notes (Signed)
  Subjective:    Bernisha Verma is a married Arabic-speaking [phone] [redacted]w[redacted]d being seen today for her first obstetrical visit.  Her obstetrical history is significant for obesity, language barrier, h/o PPROM and PTD at 34 weeks. Patient does intend to breast feed. Pregnancy history fully reviewed.  Patient reports nausea. She says that zofran 4 mg doesn't work. In a past pregnancy she used phenergan with good results.  There were no vitals filed for this visit.  HISTORY: OB History  Gravida Para Term Preterm AB Living  3 2 1 1  0 2  SAB IAB Ectopic Multiple Live Births  0 0 0 0 2    # Outcome Date GA Lbr Len/2nd Weight Sex Type Anes PTL Lv  3 Current           2 Preterm 01/24/21 [redacted]w[redacted]d / 00:09 5 lb 0.8 oz (2.29 kg) M Vag-Spont EPI  LIV  1 Term 03/03/19 [redacted]w[redacted]d 03:11 / 00:53 7 lb 13 oz (3.545 kg) M Vag-Spont EPI  LIV   Past Medical History:  Diagnosis Date   Group B streptococcal bacteriuria 08/14/2020   Will treat now and will need prophylaxis in labor     Medical history non-contributory    Preterm premature rupture of membranes (PPROM) with unknown onset of labor 01/20/2021   Past Surgical History:  Procedure Laterality Date   TONSILLECTOMY     Family History  Problem Relation Age of Onset   Diabetes Mother    Diabetes Father    Pap smear normal 2022   Exam   FHR- 160s                                    System: Breast:  normal appearance, no masses or tenderness   Skin: normal coloration and turgor, no rashes    Neurologic: oriented   Extremities: normal strength, tone, and muscle mass   HEENT PERRLA   Mouth/Teeth mucous membranes moist, pharynx normal without lesions   Neck supple   Cardiovascular: regular rate and rhythm   Respiratory:  appears well, vitals normal, no respiratory distress, acyanotic, normal RR, ear and throat exam is normal, neck free of mass or lymphadenopathy, chest clear, no wheezing, crepitations, rhonchi, normal symmetric air entry    Abdomen: soft, non-tender; bowel sounds normal; no masses,  no organomegaly          Assessment:    Pregnancy: A5W0981 Patient Active Problem List   Diagnosis Date Noted   Obesity in pregnancy 05/10/2023   History of preterm premature rupture of membranes (PPROM) 05/10/2023   Supervision of high risk pregnancy, antepartum 04/22/2023   BMI 34.0-34.9,adult 08/08/2020   Tension-type headache 08/08/2020   Constipation 04/13/2019   Language barrier affecting health care 08/08/2018        Plan:     Initial labs drawn. Prenatal vitamins. Problem list reviewed and updated. Genetic Screening : requested and drawn  Ultrasound discussed; fetal survey: ordered. Rec baby asa starting at 13 week Nausea not helped with 4 mg zofran, 8 mg prescribed Also rec'd unisom Check pr/cr ratio and TSH to complete baseline labs Flu vaccine declined  Follow up in 4 weeks.    Allie Bossier 05/10/2023

## 2023-05-10 NOTE — Progress Notes (Signed)
Pt. Presents for new ob. Pt. Would like something different for nausea. Pt. States that the Zofran does not work.

## 2023-05-11 LAB — TSH+FREE T4
Free T4: 1.24 ng/dL (ref 0.82–1.77)
TSH: 1.16 u[IU]/mL (ref 0.450–4.500)

## 2023-05-11 LAB — PROTEIN / CREATININE RATIO, URINE
Creatinine, Urine: 312.1 mg/dL
Protein, Ur: 22.5 mg/dL
Protein/Creat Ratio: 72 mg/g{creat} (ref 0–200)

## 2023-05-15 LAB — PANORAMA PRENATAL TEST FULL PANEL:PANORAMA TEST PLUS 5 ADDITIONAL MICRODELETIONS: FETAL FRACTION: 9.6

## 2023-05-15 LAB — HORIZON CUSTOM

## 2023-06-04 NOTE — Progress Notes (Signed)
   PRENATAL VISIT NOTE  Subjective:  Marissa Reed is a 28 y.o. V7Q4696 at [redacted]w[redacted]d being seen today for ongoing prenatal care.  She is currently monitored for the following issues for this high-risk pregnancy and has Language barrier affecting health care; Constipation; BMI 34.0-34.9,adult; Tension-type headache; Supervision of other high risk pregnancies, unspecified trimester; Obesity in pregnancy; and History of preterm premature rupture of membranes (PPROM) on their problem list.  Patient reports no concerns. Denies leaking of fluid, vaginal bleeding, contractions.   The following portions of the patient's history were reviewed and updated as appropriate: allergies, current medications, past family history, past medical history, past social history, past surgical history and problem list.   Objective:   Vitals:   06/07/23 1008  BP: 94/61  Pulse: 84  Weight: 223 lb 4.8 oz (101.3 kg)    Fetal Status: Fetal Heart Rate (bpm): 141   Movement: Present     General:  Alert, oriented and cooperative. Patient is in no acute distress.  Skin: Skin is warm and dry. No rash noted.   Cardiovascular: Normal heart rate noted  Respiratory: Normal respiratory effort, no problems with respiration noted  Abdomen: Soft, gravid, appropriate for gestational age.  Pain/Pressure: Present     Pelvic: Cervical exam deferred        Extremities: Normal range of motion.  Edema: None  Mental Status: Normal mood and affect. Normal behavior. Normal judgment and thought content.   Assessment and Plan:  Pregnancy: G3P1102 at [redacted]w[redacted]d  1. Encounter for supervision of other normal pregnancy, first trimester (Primary) Patient doing well BP, FHR appropriate  2. [redacted] weeks gestation of pregnancy Anticipatory guidance about next visits/weeks of pregnancy given.   3. BMI 34.0-34.9,adult A1c today  4. Language barrier Arabic interpreter utilized during encounter    Preterm labor symptoms and general obstetric  precautions including but not limited to vaginal bleeding, contractions, leaking of fluid and fetal movement were reviewed in detail with the patient.  Please refer to After Visit Summary for other counseling recommendations.   Return in about 1 month (around 07/08/2023) for LOB.  Future Appointments  Date Time Provider Department Center  07/06/2023  9:35 AM Hurshel Party, CNM [facility] None  07/12/2023  1:15 PM [facility] NURSE [facility]  07/12/2023  1:30 PM [facility] US1 [facility]    Ralene Muskrat, PA-C

## 2023-06-07 ENCOUNTER — Encounter: Payer: Self-pay | Admitting: Physician Assistant

## 2023-06-07 ENCOUNTER — Ambulatory Visit (INDEPENDENT_AMBULATORY_CARE_PROVIDER_SITE_OTHER): Payer: Medicaid Other | Admitting: Physician Assistant

## 2023-06-07 VITALS — BP 94/61 | HR 84 | Wt 223.3 lb

## 2023-06-07 DIAGNOSIS — Z6834 Body mass index (BMI) 34.0-34.9, adult: Secondary | ICD-10-CM | POA: Diagnosis not present

## 2023-06-07 DIAGNOSIS — Z603 Acculturation difficulty: Secondary | ICD-10-CM

## 2023-06-07 DIAGNOSIS — Z3A14 14 weeks gestation of pregnancy: Secondary | ICD-10-CM

## 2023-06-07 DIAGNOSIS — Z3481 Encounter for supervision of other normal pregnancy, first trimester: Secondary | ICD-10-CM

## 2023-06-07 DIAGNOSIS — Z8759 Personal history of other complications of pregnancy, childbirth and the puerperium: Secondary | ICD-10-CM | POA: Diagnosis not present

## 2023-06-07 DIAGNOSIS — Z758 Other problems related to medical facilities and other health care: Secondary | ICD-10-CM

## 2023-06-07 DIAGNOSIS — O09899 Supervision of other high risk pregnancies, unspecified trimester: Secondary | ICD-10-CM

## 2023-06-07 NOTE — Progress Notes (Signed)
 Pt presents for rob. Pt has been having a constant headache for the last 10 days. Has been taking Tylenol, it only helps for a few hours.

## 2023-06-07 NOTE — Patient Instructions (Addendum)
 Take [phone] mg Acetaminophen or Tylenol every 4-6 hours as needed for headaches. Do not take more than 3000 mg in 24 hours.   OR   You may take Excedrin Tension headache   Please do not mix these.

## 2023-06-09 LAB — CBC/D/PLT+RPR+RH+ABO+RUBIGG...
Antibody Screen: NEGATIVE
Basophils Absolute: 0 10*3/uL (ref 0.0–0.2)
Basos: 0 %
EOS (ABSOLUTE): 0 10*3/uL (ref 0.0–0.4)
Eos: 1 %
HCV Ab: NONREACTIVE
HIV Screen 4th Generation wRfx: NONREACTIVE
Hematocrit: 34.5 % (ref 34.0–46.6)
Hemoglobin: 11.4 g/dL (ref 11.1–15.9)
Hepatitis B Surface Ag: NEGATIVE
Immature Grans (Abs): 0 10*3/uL (ref 0.0–0.1)
Immature Granulocytes: 0 %
Lymphocytes Absolute: 1.6 10*3/uL (ref 0.7–3.1)
Lymphs: 33 %
MCH: 29.5 pg (ref 26.6–33.0)
MCHC: 33 g/dL (ref 31.5–35.7)
MCV: 89 fL (ref 79–97)
Monocytes Absolute: 0.4 10*3/uL (ref 0.1–0.9)
Monocytes: 8 %
Neutrophils Absolute: 2.8 10*3/uL (ref 1.4–7.0)
Neutrophils: 58 %
Platelets: 293 10*3/uL (ref 150–450)
RBC: 3.86 x10E6/uL (ref 3.77–5.28)
RDW: 14.3 % (ref 11.7–15.4)
RPR Ser Ql: NONREACTIVE
Rh Factor: POSITIVE
Rubella Antibodies, IGG: 1.85 {index} (ref >0.99)
WBC: 4.9 10*3/uL (ref 3.4–10.8)

## 2023-06-09 LAB — HEMOGLOBIN A1C
Est. average glucose Bld gHb Est-mCnc: 100 mg/dL
Hgb A1c MFr Bld: 5.1 % (ref 4.8–5.6)

## 2023-06-09 LAB — HCV INTERPRETATION

## 2023-06-16 ENCOUNTER — Encounter: Payer: Self-pay | Admitting: Physician Assistant

## 2023-07-06 ENCOUNTER — Ambulatory Visit (INDEPENDENT_AMBULATORY_CARE_PROVIDER_SITE_OTHER): Admitting: Advanced Practice Midwife

## 2023-07-06 VITALS — BP 106/63 | HR 92 | Wt 228.0 lb

## 2023-07-06 DIAGNOSIS — Z603 Acculturation difficulty: Secondary | ICD-10-CM

## 2023-07-06 DIAGNOSIS — O219 Vomiting of pregnancy, unspecified: Secondary | ICD-10-CM

## 2023-07-06 DIAGNOSIS — Z3481 Encounter for supervision of other normal pregnancy, first trimester: Secondary | ICD-10-CM

## 2023-07-06 DIAGNOSIS — Z758 Other problems related to medical facilities and other health care: Secondary | ICD-10-CM

## 2023-07-06 DIAGNOSIS — Z8759 Personal history of other complications of pregnancy, childbirth and the puerperium: Secondary | ICD-10-CM

## 2023-07-06 DIAGNOSIS — Z3A18 18 weeks gestation of pregnancy: Secondary | ICD-10-CM

## 2023-07-06 NOTE — Progress Notes (Signed)
   PRENATAL VISIT NOTE  Subjective:  Marissa Reed is a 28 y.o. N8G9562 at [redacted]w[redacted]d being seen today for ongoing prenatal care.  She is currently monitored for the following issues for this low-risk pregnancy and has Language barrier affecting health care; BMI 34.0-34.9,adult; Supervision of other high risk pregnancies, unspecified trimester; Obesity in pregnancy; and History of preterm premature rupture of membranes (PPROM) on their problem list.  Patient reports no complaints.  Contractions: Irregular. Vag. Bleeding: None.  Movement: Present. Denies leaking of fluid.   The following portions of the patient's history were reviewed and updated as appropriate: allergies, current medications, past family history, past medical history, past social history, past surgical history and problem list.   Objective:   Vitals:   07/06/23 0939  BP: 106/63  Pulse: 92  Weight: 228 lb (103.4 kg)    Fetal Status: Fetal Heart Rate (bpm): 140   Movement: Present     General:  Alert, oriented and cooperative. Patient is in no acute distress.  Skin: Skin is warm and dry. No rash noted.   Cardiovascular: Normal heart rate noted  Respiratory: Normal respiratory effort, no problems with respiration noted  Abdomen: Soft, gravid, appropriate for gestational age.  Pain/Pressure: Absent     Pelvic: Cervical exam deferred        Extremities: Normal range of motion.  Edema: None  Mental Status: Normal mood and affect. Normal behavior. Normal judgment and thought content.   Assessment and Plan:  Pregnancy: G3P1102 at [redacted]w[redacted]d 1. Encounter for supervision of other normal pregnancy, first trimester (Primary) --Anticipatory guidance about next visits/weeks of pregnancy given.  --Questions answered about appropriate weight gain and diet in pregnancy.  Pt concerned about weight gain but total gain of 14 lbs is wnl. Reassurance provided.  Discussed expected weight loss postpartum and benefits of breastfeeding.   2. Nausea  and vomiting during pregnancy prior to [redacted] weeks gestation --Improved on Zofran  3. Language barrier --Arabic interpreter used for all communication  4. History of preterm premature rupture of membranes (PPROM)   Preterm labor symptoms and general obstetric precautions including but not limited to vaginal bleeding, contractions, leaking of fluid and fetal movement were reviewed in detail with the patient. Please refer to After Visit Summary for other counseling recommendations.   Return in about 4 weeks (around 08/03/2023).  Future Appointments  Date Time Provider Department Center  07/12/2023  1:00 PM [facility] PROVIDER 1 [facility]  07/12/2023  1:30 PM [facility] US1 [facility]  08/03/2023 10:35 AM [place]Kirby, Wilmer Floor, CNM [facility] None    Sharen Counter, CNM

## 2023-07-06 NOTE — Progress Notes (Signed)
 Denies problems today.

## 2023-07-08 ENCOUNTER — Ambulatory Visit: Attending: Obstetrics and Gynecology | Admitting: *Deleted

## 2023-07-08 DIAGNOSIS — Z3689 Encounter for other specified antenatal screening: Secondary | ICD-10-CM

## 2023-07-08 DIAGNOSIS — Z3A18 18 weeks gestation of pregnancy: Secondary | ICD-10-CM

## 2023-07-08 LAB — AFP, SERUM, OPEN SPINA BIFIDA
AFP MoM: 1.19
AFP Value: 39.9 ng/mL
Gest. Age on Collection Date: 18 wk
Maternal Age At EDD: 26.4 a
OSBR Risk 1 IN: 6704
Test Results:: NEGATIVE
Weight: 228 [lb_av]

## 2023-07-08 NOTE — Progress Notes (Signed)
 New OB Intake  I connected with Marissa Reed  on 07/08/23 at 1412 by phone and verified that I am speaking with the correct person using two identifiers. [facility] located at [facility] and pt is located at home.   I explained I am completing New Patient Intake today. We discussed EDD of 12/04/2023, by Last Menstrual Period. Pt is G3P1102. I reviewed her allergies, medications and Medical/Surgical/OB history.    Patient Active Problem List   Diagnosis Date Noted   Obesity in pregnancy 05/10/2023   History of preterm premature rupture of membranes (PPROM) 05/10/2023   Supervision of other high risk pregnancies, unspecified trimester 04/22/2023   BMI 34.0-34.9,adult 08/08/2020   Language barrier affecting health care 08/08/2018    Patient advised of first appointment in our office and when to arrive.   All questions were answered.

## 2023-07-12 ENCOUNTER — Other Ambulatory Visit: Payer: Self-pay | Admitting: *Deleted

## 2023-07-12 ENCOUNTER — Other Ambulatory Visit: Payer: Self-pay

## 2023-07-12 ENCOUNTER — Ambulatory Visit: Payer: Medicaid Other | Attending: Obstetrics and Gynecology

## 2023-07-12 ENCOUNTER — Ambulatory Visit (HOSPITAL_BASED_OUTPATIENT_CLINIC_OR_DEPARTMENT_OTHER): Admitting: Maternal & Fetal Medicine

## 2023-07-12 ENCOUNTER — Ambulatory Visit: Payer: Medicaid Other

## 2023-07-12 VITALS — BP 100/54 | HR 88

## 2023-07-12 DIAGNOSIS — O99212 Obesity complicating pregnancy, second trimester: Secondary | ICD-10-CM

## 2023-07-12 DIAGNOSIS — O0990 Supervision of high risk pregnancy, unspecified, unspecified trimester: Secondary | ICD-10-CM | POA: Insufficient documentation

## 2023-07-12 DIAGNOSIS — O99210 Obesity complicating pregnancy, unspecified trimester: Secondary | ICD-10-CM

## 2023-07-12 DIAGNOSIS — Z3A19 19 weeks gestation of pregnancy: Secondary | ICD-10-CM

## 2023-07-12 DIAGNOSIS — O09899 Supervision of other high risk pregnancies, unspecified trimester: Secondary | ICD-10-CM | POA: Diagnosis present

## 2023-07-12 DIAGNOSIS — Z8759 Personal history of other complications of pregnancy, childbirth and the puerperium: Secondary | ICD-10-CM | POA: Diagnosis not present

## 2023-07-12 DIAGNOSIS — E669 Obesity, unspecified: Secondary | ICD-10-CM

## 2023-07-12 DIAGNOSIS — O09212 Supervision of pregnancy with history of pre-term labor, second trimester: Secondary | ICD-10-CM

## 2023-07-12 NOTE — Progress Notes (Addendum)
   Patient information  Patient Name: Marissa Reed  Patient MRN:   [id number]  Referring practice: MFM Referring Provider: [facility])  MFM CONSULT  Marissa Reed is a 29 y.o. [id number] at [redacted]w[redacted]d here for ultrasound and consultation. Patient Active Problem List   Diagnosis Date Noted   Obesity in pregnancy 05/10/2023   History of preterm premature rupture of membranes (PPROM) 05/10/2023   Supervision of other high risk pregnancies, unspecified trimester 04/22/2023   BMI 34.0-34.9,adult 08/08/2020   Language barrier affecting health care 08/08/2018   [facility] is doing well today with no acute concerns.  RE history of preterm birth: Patient reports that her water broke around 33 weeks delivering a 34-week fetus.  Per the patient there was no identifiable cause of the rupture of membranes.  She had a previous term delivery before this pregnancy.  I discussed there is no effective way to reduce the risk of preterm birth of the cervical length screening followed by vaginal progesterone.  Today the cervix appears normal.  I reassured the patient that having a term delivery is also protective against preterm birth.  Patient will monitor her symptoms and return in 9 weeks for a 28-week ultrasound.  Sonographic findings Single intrauterine pregnancy at 19w 4d. Fetal cardiac activity:  Observed and appears normal. Presentation: Cephalic. The anatomic structures that were well seen appear normal without evidence of soft markers. The anatomic survey is complete.  Fetal biometry shows the estimated fetal weight at the 61 percentile. Amniotic fluid: Within normal limits.  MVP: 6.88 cm. Placenta: Posterior Fundal. Adnexa: No abnormality visualized. Cervical length: 3.4 cm.  There are limitations of prenatal ultrasound such as the inability to detect certain abnormalities due to poor visualization. Various factors such as fetal position, gestational age and maternal body  habitus may increase the difficulty in visualizing the fetal anatomy.    Recommendations -EDD should be 12/02/2023 based on  U/S C R L  (04/22/23). -F/u growth US  at 28 weeks   Review of Systems: A review of systems was performed and was negative except per HPI   Vitals and Physical Exam    07/12/2023    1:17 PM 07/06/2023    9:39 AM 06/07/2023   10:08 AM  Vitals with BMI  Weight  228 lbs 223 lbs 5 oz  Systolic 100 106 94  Diastolic 54 63 61  Pulse 88 92 84    Sitting comfortably on the sonogram table Nonlabored breathing Normal rate and rhythm Abdomen is nontender  Past pregnancies OB History  Gravida Para Term Preterm AB Living  3 2 1 1  0 2  SAB IAB Ectopic Multiple Live Births  0 0 0 0 2    # Outcome Date GA Lbr Len/2nd Weight Sex Type Anes PTL Lv  3 Current           2 Preterm 01/24/21 [redacted]w[redacted]d / 00:09 5 lb 0.8 oz (2.29 kg) M Vag-Spont EPI  LIV  1 Term 03/03/19 [redacted]w[redacted]d 03:11 / 00:53 7 lb 13 oz (3.545 kg) M Vag-Spont EPI  LIV    I spent 30 minutes reviewing the patients chart, including labs and images as well as counseling the patient about her medical conditions. Greater than 50% of the time was spent in direct face-to-face patient counseling.  Penney Bowling  MFM, [place]   07/12/2023  2:30 PM

## 2023-08-03 ENCOUNTER — Ambulatory Visit: Admitting: Advanced Practice Midwife

## 2023-08-03 ENCOUNTER — Encounter: Payer: Self-pay | Admitting: Advanced Practice Midwife

## 2023-08-03 VITALS — BP 98/62 | HR 90 | Wt 236.0 lb

## 2023-08-03 DIAGNOSIS — R109 Unspecified abdominal pain: Secondary | ICD-10-CM

## 2023-08-03 DIAGNOSIS — Z603 Acculturation difficulty: Secondary | ICD-10-CM | POA: Diagnosis not present

## 2023-08-03 DIAGNOSIS — O26899 Other specified pregnancy related conditions, unspecified trimester: Secondary | ICD-10-CM

## 2023-08-03 DIAGNOSIS — O26892 Other specified pregnancy related conditions, second trimester: Secondary | ICD-10-CM

## 2023-08-03 DIAGNOSIS — Z3481 Encounter for supervision of other normal pregnancy, first trimester: Secondary | ICD-10-CM | POA: Diagnosis not present

## 2023-08-03 DIAGNOSIS — N898 Other specified noninflammatory disorders of vagina: Secondary | ICD-10-CM

## 2023-08-03 DIAGNOSIS — Z758 Other problems related to medical facilities and other health care: Secondary | ICD-10-CM

## 2023-08-03 NOTE — Progress Notes (Signed)
   PRENATAL VISIT NOTE  Subjective:  Marissa Reed is a 28 y.o. G3P1102 at [redacted]w[redacted]d being seen today for ongoing prenatal care.  She is currently monitored for the following issues for this low-risk pregnancy and has Language barrier affecting health care; BMI 34.0-34.9,adult; Supervision of other high risk pregnancies, unspecified trimester; Obesity in pregnancy; and History of preterm premature rupture of membranes (PPROM) on their problem list.  Patient reports  vaginal discharge and lower abdominal cramping .  Contractions: Not present. Vag. Bleeding: None.  Movement: Increased. Denies leaking of fluid.   The following portions of the patient's history were reviewed and updated as appropriate: allergies, current medications, past family history, past medical history, past social history, past surgical history and problem list.   Objective:   Vitals:   08/03/23 1046  BP: 98/62  Pulse: 90  Weight: 236 lb (107 kg)    Fetal Status: Fetal Heart Rate (bpm): 140 Fundal Height: 22 cm Movement: Increased     General:  Alert, oriented and cooperative. Patient is in no acute distress.  Skin: Skin is warm and dry. No rash noted.   Cardiovascular: Normal heart rate noted  Respiratory: Normal respiratory effort, no problems with respiration noted  Abdomen: Soft, gravid, appropriate for gestational age.  Pain/Pressure: Present     Pelvic: Cervical exam deferred        Extremities: Normal range of motion.  Edema: None  Mental Status: Normal mood and affect. Normal behavior. Normal judgment and thought content.   Assessment and Plan:  Pregnancy: G3P1102 at [redacted]w[redacted]d 1. Encounter for supervision of other normal pregnancy, first trimester (Primary) --Anticipatory guidance about next visits/weeks of pregnancy given.  --GTT at next visit --Questions answered about BASA in pregnancy, PEC prevention. Pt to resume taking.  2. Language barrier --Arabic interpreter used for all communication  3. Abdominal  pain affecting pregnancy --Pt reports some mild cramping when she sits or walks for a long period of time, pain resolves with position change/rest --Rest/ice/heat/warm bath/increase PO fluids/Tylenol /pregnancy support belt   4. Vaginal discharge during pregnancy in second trimester --white, milky discharge, no itching/burning or odor.   --Likely normal leukorrhea of pregnancy, pt to notify if symptoms develop  Preterm labor symptoms and general obstetric precautions including but not limited to vaginal bleeding, contractions, leaking of fluid and fetal movement were reviewed in detail with the patient. Please refer to After Visit Summary for other counseling recommendations.   Return in about 4 weeks (around 08/31/2023) for Female provider preferred, LOB, GTT at next visit.  Future Appointments  Date Time Provider Department Center  08/31/2023  8:45 AM [facility] LAB [facility] None  08/31/2023  9:55 AM Tari Fare, CNM [facility] None  09/13/2023  1:00 PM [facility] PROVIDER 1 [facility]  09/13/2023  1:30 PM [facility] US2 [facility]    Arlester Bence, CNM

## 2023-08-03 NOTE — Progress Notes (Signed)
 Pt reports pain and pressure in lower abdomen (scale of 4) for the last few days.   Pt reports thick "mucus like" discharge that is sometimes clear/white in color. No itching.   She has not started taking the aspirin  yet.   No other concerns at this time.

## 2023-08-31 ENCOUNTER — Other Ambulatory Visit

## 2023-08-31 ENCOUNTER — Ambulatory Visit (INDEPENDENT_AMBULATORY_CARE_PROVIDER_SITE_OTHER): Admitting: Certified Nurse Midwife

## 2023-08-31 ENCOUNTER — Encounter: Payer: Self-pay | Admitting: Certified Nurse Midwife

## 2023-08-31 VITALS — BP 95/66 | HR 83 | Wt 243.8 lb

## 2023-08-31 DIAGNOSIS — Z758 Other problems related to medical facilities and other health care: Secondary | ICD-10-CM

## 2023-08-31 DIAGNOSIS — Z3492 Encounter for supervision of normal pregnancy, unspecified, second trimester: Secondary | ICD-10-CM | POA: Diagnosis not present

## 2023-08-31 DIAGNOSIS — Z3A26 26 weeks gestation of pregnancy: Secondary | ICD-10-CM

## 2023-08-31 DIAGNOSIS — Z603 Acculturation difficulty: Secondary | ICD-10-CM

## 2023-08-31 NOTE — Progress Notes (Signed)
   PRENATAL VISIT NOTE  Subjective:  Marissa Reed is a 28 y.o. G3P1102 at [redacted]w[redacted]d being seen today for ongoing prenatal care.  She is currently monitored for the following issues for this high-risk pregnancy and has Language barrier affecting health care; BMI 34.0-34.9,adult; Supervision of other high risk pregnancies, unspecified trimester; Obesity in pregnancy; and History of preterm premature rupture of membranes (PPROM) on their problem list.  Patient reports vomiting and feelings of lightheadedness and dizziness following glucose testing.   Contractions: Not present. Vag. Bleeding: None.  Movement: Present. Denies leaking of fluid.   The following portions of the patient's history were reviewed and updated as appropriate: allergies, current medications, past family history, past medical history, past social history, past surgical history and problem list.   Objective:    Vitals:   08/31/23 0914 08/31/23 0957  BP: 93/60 95/66  Pulse: 90 83  Weight: 243 lb 12.8 oz (110.6 kg)     Fetal Status:  Fetal Heart Rate (bpm): 140   Movement: Present    General: Alert, oriented and cooperative. Patient is in no acute distress.  Skin: Skin is warm and dry. No rash noted.   Cardiovascular: Normal heart rate noted  Respiratory: Normal respiratory effort, no problems with respiration noted  Abdomen: Soft, gravid, appropriate for gestational age.  Pain/Pressure: Absent     Pelvic: Cervical exam deferred        Extremities: Normal range of motion.  Edema: Trace  Mental Status: Normal mood and affect. Normal behavior. Normal judgment and thought content.   Assessment and Plan:  Pregnancy: G3P1102 at [redacted]w[redacted]d 1. Encounter for supervision of low-risk pregnancy in second trimester (Primary) - Patient doing okay.  - Reports vigorous fetal movement    2. [redacted] weeks gestation of pregnancy - Attempted GTT today, however had a few episodes of emesis, and was unable to complete.  - Plan for repeat testing  within the next week or so.  - Recommended to eat a high protein diet the night prior to testing. Also discussed the use of Zofran  prior to testing to aid in nausea and vomiting  - HIV antibody (with reflex) - RPR - CBC  3. Language barrier Due to language barrier, an interpreter was present during the history-taking and subsequent discussion (and for part of the physical exam) with this patient. - In person Arabic interpreter used for the duration of this visit.   Preterm labor symptoms and general obstetric precautions including but not limited to vaginal bleeding, contractions, leaking of fluid and fetal movement were reviewed in detail with the patient. Please refer to After Visit Summary for other counseling recommendations.   Return in about 2 weeks (around 09/14/2023) for HROB.  Future Appointments  Date Time Provider Department Center  09/07/2023  8:00 AM [facility] None  09/13/2023  1:00 PM [facility] PROVIDER 1 [facility]  09/13/2023  1:30 PM [facility] US2 [facility]  09/16/2023 10:15 AM Davis, Devon E, PA-C [facility] None    Kristelle Cavallaro (Maurie Southern) Marlys Singh, MSN, CNM  Center for [employer]  08/31/2023 10:18 AM

## 2023-08-31 NOTE — Progress Notes (Signed)
 Pt presents for ROB visit. No concerns

## 2023-09-01 LAB — CBC
Hematocrit: 37 % (ref 34.0–46.6)
Hemoglobin: 11.8 g/dL (ref 11.1–15.9)
MCH: 29.9 pg (ref 26.6–33.0)
MCHC: 31.9 g/dL (ref 31.5–35.7)
MCV: 94 fL (ref 79–97)
Platelets: 245 10*3/uL (ref 150–450)
RBC: 3.95 x10E6/uL (ref 3.77–5.28)
RDW: 13.5 % (ref 11.7–15.4)
WBC: 7 10*3/uL (ref 3.4–10.8)

## 2023-09-01 LAB — RPR: RPR Ser Ql: NONREACTIVE

## 2023-09-01 LAB — HIV ANTIBODY (ROUTINE TESTING W REFLEX): HIV Screen 4th Generation wRfx: NONREACTIVE

## 2023-09-07 ENCOUNTER — Other Ambulatory Visit

## 2023-09-07 DIAGNOSIS — Z3492 Encounter for supervision of normal pregnancy, unspecified, second trimester: Secondary | ICD-10-CM

## 2023-09-07 DIAGNOSIS — Z3A27 27 weeks gestation of pregnancy: Secondary | ICD-10-CM

## 2023-09-08 LAB — GLUCOSE TOLERANCE, 2 HOURS W/ 1HR
Glucose, 1 hour: 127 mg/dL (ref 70–179)
Glucose, 2 hour: 65 mg/dL — ABNORMAL LOW (ref 70–152)
Glucose, Fasting: 79 mg/dL (ref 70–91)

## 2023-09-09 ENCOUNTER — Ambulatory Visit: Payer: Self-pay | Admitting: Certified Nurse Midwife

## 2023-09-13 ENCOUNTER — Ambulatory Visit

## 2023-09-13 ENCOUNTER — Ambulatory Visit: Attending: Maternal & Fetal Medicine | Admitting: Maternal & Fetal Medicine

## 2023-09-13 VITALS — BP 96/56 | HR 80

## 2023-09-13 DIAGNOSIS — Z3689 Encounter for other specified antenatal screening: Secondary | ICD-10-CM | POA: Insufficient documentation

## 2023-09-13 DIAGNOSIS — E669 Obesity, unspecified: Secondary | ICD-10-CM

## 2023-09-13 DIAGNOSIS — Z8759 Personal history of other complications of pregnancy, childbirth and the puerperium: Secondary | ICD-10-CM | POA: Insufficient documentation

## 2023-09-13 DIAGNOSIS — O09899 Supervision of other high risk pregnancies, unspecified trimester: Secondary | ICD-10-CM

## 2023-09-13 DIAGNOSIS — O09213 Supervision of pregnancy with history of pre-term labor, third trimester: Secondary | ICD-10-CM | POA: Diagnosis not present

## 2023-09-13 DIAGNOSIS — Z3A28 28 weeks gestation of pregnancy: Secondary | ICD-10-CM | POA: Diagnosis not present

## 2023-09-13 DIAGNOSIS — O99213 Obesity complicating pregnancy, third trimester: Secondary | ICD-10-CM

## 2023-09-13 DIAGNOSIS — O99210 Obesity complicating pregnancy, unspecified trimester: Secondary | ICD-10-CM

## 2023-09-13 NOTE — Progress Notes (Signed)
   Patient information  Patient Name: Marissa Reed  Patient MRN:   [id number]  Referring practice: MFM Referring Provider: [facility])  Problem List   Patient Active Problem List   Diagnosis Date Noted   Obesity in pregnancy 05/10/2023   History of preterm premature rupture of membranes (PPROM) 05/10/2023   Supervision of other high risk pregnancies, unspecified trimester 04/22/2023   BMI 34.0-34.9,adult 08/08/2020   Language barrier affecting health care 08/08/2018    Maternal Fetal medicine Consult  Marissa Reed is a 28 y.o. G3P1102 at [redacted]w[redacted]d here for ultrasound and consultation. Marissa Reed is doing well today with no acute concerns. Today we focused on the following:    Hx of preterm birth: The patient has a hx of PTB at 34 weeks due to PPROM. She denies contractions, bleeding or loss of fluid.   Sonographic findings Single intrauterine pregnancy at 28w 4d.  Fetal cardiac activity:  Observed and appears normal. Presentation: Cephalic. Interval fetal anatomy appears normal. Fetal biometry shows the estimated fetal weight at the 40 percentile. Amniotic fluid volume: Within normal limits. MVP: 4.35 cm. Placenta: Posterior.  There are limitations of prenatal ultrasound such as the inability to detect certain abnormalities due to poor visualization. Various factors such as fetal position, gestational age and maternal body habitus may increase the difficulty in visualizing the fetal anatomy.    Recommendations -No further ultrasounds are recommended at this time based on the current indications. If future indications arise (e.g. size/date discrepancy on fundal height, gestational diabetes or hypertension) and an ultrasound is to be desired at our MFM office, please send a referral.  -Preterm birth precautions given.  -No further ultrasounds are recommended at this time based on the current indications. If future indications arise (e.g. size/date  discrepancy on fundal height, gestational diabetes or hypertension) and an ultrasound is to be desired at our MFM office, please send a referral.   Review of Systems: A review of systems was performed and was negative except per HPI   Vitals and Physical Exam    09/13/2023   12:54 PM 08/31/2023    9:57 AM 08/31/2023    9:14 AM  Vitals with BMI  Weight   243 lbs 13 oz  Systolic 96 95 93  Diastolic 56 66 60  Pulse 80 83 90    Sitting comfortably on the sonogram table Nonlabored breathing Normal rate and rhythm Abdomen is nontender  Past pregnancies OB History  Gravida Para Term Preterm AB Living  3 2 1 1  0 2  SAB IAB Ectopic Multiple Live Births  0 0 0 0 2    # Outcome Date GA Lbr Len/2nd Weight Sex Type Anes PTL Lv  3 Current           2 Preterm 01/24/21 [redacted]w[redacted]d / 00:09 5 lb 0.8 oz (2.29 kg) M Vag-Spont EPI  LIV  1 Term 03/03/19 [redacted]w[redacted]d 03:11 / 00:53 7 lb 13 oz (3.545 kg) M Vag-Spont EPI  LIV     I spent 20 minutes reviewing the patients chart, including labs and images as well as counseling the patient about her medical conditions. Greater than 50% of the time was spent in direct face-to-face patient counseling.  Penney Bowling  MFM, [place]   09/13/2023  1:45 PM

## 2023-09-16 ENCOUNTER — Encounter: Payer: Self-pay | Admitting: Physician Assistant

## 2023-09-16 ENCOUNTER — Ambulatory Visit: Payer: Self-pay | Admitting: Physician Assistant

## 2023-09-16 VITALS — BP 94/63 | HR 99 | Wt 244.4 lb

## 2023-09-16 DIAGNOSIS — O368130 Decreased fetal movements, third trimester, not applicable or unspecified: Secondary | ICD-10-CM

## 2023-09-16 DIAGNOSIS — O09899 Supervision of other high risk pregnancies, unspecified trimester: Secondary | ICD-10-CM

## 2023-09-16 DIAGNOSIS — Z3A28 28 weeks gestation of pregnancy: Secondary | ICD-10-CM

## 2023-09-16 DIAGNOSIS — Z8759 Personal history of other complications of pregnancy, childbirth and the puerperium: Secondary | ICD-10-CM | POA: Diagnosis not present

## 2023-09-16 DIAGNOSIS — Z758 Other problems related to medical facilities and other health care: Secondary | ICD-10-CM

## 2023-09-16 DIAGNOSIS — Z603 Acculturation difficulty: Secondary | ICD-10-CM | POA: Diagnosis not present

## 2023-09-16 NOTE — Progress Notes (Signed)
 Pt states she has noted a decrease in baby movement the past couple of days.   Pt states she will think about the Tdap vaccine and let us  know next appt.

## 2023-09-16 NOTE — Progress Notes (Signed)
   PRENATAL VISIT NOTE  Subjective:  Marissa Reed is a 28 y.o. G3P1102 at [redacted]w[redacted]d being seen today for ongoing prenatal care.  She is currently monitored for the following issues for this high-risk pregnancy and has Language barrier affecting health care; BMI 34.0-34.9,adult; Supervision of other high risk pregnancies, unspecified trimester; Obesity in pregnancy; and History of preterm premature rupture of membranes (PPROM) on their problem list.  Patient reports no complaints.  Contractions: Irritability. Vag. Bleeding: None.  Movement: (!) Decreased. Denies leaking of fluid.   The following portions of the patient's history were reviewed and updated as appropriate: allergies, current medications, past family history, past medical history, past social history, past surgical history and problem list.   Objective:    Vitals:   09/16/23 1024  BP: 94/63  Pulse: 99  Weight: 244 lb 6.4 oz (110.9 kg)    Fetal Status:  Fetal Heart Rate (bpm): 134 Fundal Height: 28 cm Movement: (!) Decreased    General: Alert, oriented and cooperative. Patient is in no acute distress.  Skin: Skin is warm and dry. No rash noted.   Cardiovascular: Normal heart rate noted  Respiratory: Normal respiratory effort, no problems with respiration noted  Abdomen: Soft, gravid, appropriate for gestational age.  Pain/Pressure: Absent     Pelvic: Cervical exam deferred        Extremities: Normal range of motion.  Edema: Trace  Mental Status: Normal mood and affect. Normal behavior. Normal judgment and thought content.   Assessment and Plan:  Pregnancy: G3P1102 at [redacted]w[redacted]d  1. Supervision of other high risk pregnancies, unspecified trimester (Primary) Patient doing well, feeling regular fetal movement  BP, FHR appropriate  2. [redacted] weeks gestation of pregnancy Anticipatory guidance about next visits/weeks of pregnancy given.   3. Decreased fetal movement Patient reports decreased fetal movement x1 week with no interim  interventions. We discussed use of sugary beverage to provoke fetal movement and fetal kick counts for monitoring at home. We reviewed signs that should prompt MAU evaluation, to which she states understanding.  -NST reactive, Cat 1  4. History of preterm premature rupture of membranes (PPROM) G2, [redacted]w[redacted]d Patient denies contractions, fluid loss, bleeding this pregnancy  5. Language barrier Arabic interpreter utilized during encounter   Preterm labor symptoms and general obstetric precautions including but not limited to vaginal bleeding, contractions, leaking of fluid and fetal movement were reviewed in detail with the patient.  Please refer to After Visit Summary for other counseling recommendations.   No follow-ups on file.  Future Appointments  Date Time Provider Department Center  09/28/2023  1:50 PM Leftwich-Kirby, Darren Em, CNM [facility] None     Luevenia Saha, PA-C

## 2023-09-28 ENCOUNTER — Ambulatory Visit (INDEPENDENT_AMBULATORY_CARE_PROVIDER_SITE_OTHER): Admitting: Advanced Practice Midwife

## 2023-09-28 ENCOUNTER — Encounter: Payer: Self-pay | Admitting: Advanced Practice Midwife

## 2023-09-28 VITALS — BP 98/66 | HR 102 | Wt 248.0 lb

## 2023-09-28 DIAGNOSIS — O0990 Supervision of high risk pregnancy, unspecified, unspecified trimester: Secondary | ICD-10-CM | POA: Diagnosis not present

## 2023-09-28 DIAGNOSIS — Z8759 Personal history of other complications of pregnancy, childbirth and the puerperium: Secondary | ICD-10-CM | POA: Diagnosis not present

## 2023-09-28 DIAGNOSIS — Z3A30 30 weeks gestation of pregnancy: Secondary | ICD-10-CM

## 2023-09-28 NOTE — Progress Notes (Signed)
Pt present for ROB visit. No concerns

## 2023-09-28 NOTE — Progress Notes (Signed)
   PRENATAL VISIT NOTE  Subjective:  Marissa Reed is a 28 y.o. G3P1102 at [redacted]w[redacted]d being seen today for ongoing prenatal care.  She is currently monitored for the following issues for this high-risk pregnancy and has Language barrier affecting health care; BMI 34.0-34.9,adult; Supervision of other high risk pregnancies, unspecified trimester; Obesity in pregnancy; and History of preterm premature rupture of membranes (PPROM) on their problem list.  Patient reports no complaints.  Contractions: Not present. Vag. Bleeding: None.  Movement: Present. Denies leaking of fluid.   The following portions of the patient's history were reviewed and updated as appropriate: allergies, current medications, past family history, past medical history, past social history, past surgical history and problem list.   Objective:    Vitals:   09/28/23 1353  BP: 98/66  Pulse: (!) 102  Weight: 248 lb (112.5 kg)    Fetal Status:  Fetal Heart Rate (bpm): 139 Fundal Height: 30 cm Movement: Present    General: Alert, oriented and cooperative. Patient is in no acute distress.  Skin: Skin is warm and dry. No rash noted.   Cardiovascular: Normal heart rate noted  Respiratory: Normal respiratory effort, no problems with respiration noted  Abdomen: Soft, gravid, appropriate for gestational age.  Pain/Pressure: Absent     Pelvic: Cervical exam deferred        Extremities: Normal range of motion.  Edema: None  Mental Status: Normal mood and affect. Normal behavior. Normal judgment and thought content.   Assessment and Plan:  Pregnancy: G3P1102 at [redacted]w[redacted]d 1. Supervision of high risk pregnancy, antepartum (Primary) --Anticipatory guidance about next visits/weeks of pregnancy given.   2. History of preterm premature rupture of membranes (PPROM) --PPROM with previous pregnancy, no s/sx of PTL today  3. [redacted] weeks gestation of pregnancy   Preterm labor symptoms and general obstetric precautions including but not limited to  vaginal bleeding, contractions, leaking of fluid and fetal movement were reviewed in detail with the patient. Please refer to After Visit Summary for other counseling recommendations.   Return in about 2 weeks (around 10/12/2023) for Female provider preferred.  No future appointments.  Olam Boards, CNM

## 2023-10-12 ENCOUNTER — Ambulatory Visit (INDEPENDENT_AMBULATORY_CARE_PROVIDER_SITE_OTHER): Admitting: Advanced Practice Midwife

## 2023-10-12 ENCOUNTER — Other Ambulatory Visit (HOSPITAL_COMMUNITY)
Admission: RE | Admit: 2023-10-12 | Discharge: 2023-10-12 | Disposition: A | Discharge status: Home / Self Care | Source: Ambulatory Visit | Attending: Advanced Practice Midwife | Admitting: Advanced Practice Midwife

## 2023-10-12 VITALS — BP 95/64 | HR 108 | Wt 251.0 lb

## 2023-10-12 DIAGNOSIS — B3731 Acute candidiasis of vulva and vagina: Secondary | ICD-10-CM

## 2023-10-12 DIAGNOSIS — O0990 Supervision of high risk pregnancy, unspecified, unspecified trimester: Secondary | ICD-10-CM | POA: Diagnosis not present

## 2023-10-12 DIAGNOSIS — Z603 Acculturation difficulty: Secondary | ICD-10-CM | POA: Diagnosis not present

## 2023-10-12 DIAGNOSIS — Z3A32 32 weeks gestation of pregnancy: Secondary | ICD-10-CM

## 2023-10-12 DIAGNOSIS — Z758 Other problems related to medical facilities and other health care: Secondary | ICD-10-CM

## 2023-10-12 DIAGNOSIS — Z8759 Personal history of other complications of pregnancy, childbirth and the puerperium: Secondary | ICD-10-CM | POA: Diagnosis not present

## 2023-10-12 MED ORDER — TERCONAZOLE 0.4 % VA CREA: 1.0000 | TOPICAL_CREAM | Freq: Every day | VAGINAL | 0 refills | Status: DC

## 2023-10-12 NOTE — Progress Notes (Signed)
 Pt presents for ROB visit. Pt c/o white vaginal discharge and itching.

## 2023-10-12 NOTE — Progress Notes (Signed)
   PRENATAL VISIT NOTE  Subjective:  Marissa Reed is a 28 y.o. G3P1102 at [redacted]w[redacted]d being seen today for ongoing prenatal care.  She is currently monitored for the following issues for this high-risk pregnancy and has Language barrier affecting health care; BMI 34.0-34.9,adult; Supervision of other high risk pregnancies, unspecified trimester; Obesity in pregnancy; and History of preterm premature rupture of membranes (PPROM) on their problem list.  Patient reports occasional contractions.  Contractions: Not present. Vag. Bleeding: None.  Movement: Present. Denies leaking of fluid.   The following portions of the patient's history were reviewed and updated as appropriate: allergies, current medications, past family history, past medical history, past social history, past surgical history and problem list.   Objective:    Vitals:   10/12/23 1354  BP: 95/64  Pulse: (!) 108  Weight: 251 lb (113.9 kg)    Fetal Status:  Fetal Heart Rate (bpm): 155 Fundal Height: 32 cm Movement: Present    General: Alert, oriented and cooperative. Patient is in no acute distress.  Skin: Skin is warm and dry. No rash noted.   Cardiovascular: Normal heart rate noted  Respiratory: Normal respiratory effort, no problems with respiration noted  Abdomen: Soft, gravid, appropriate for gestational age.  Pain/Pressure: Present     Pelvic: Cervical exam deferred        Extremities: Normal range of motion.  Edema: Trace  Mental Status: Normal mood and affect. Normal behavior. Normal judgment and thought content.   Assessment and Plan:  Pregnancy: G3P1102 at [redacted]w[redacted]d 1. Vaginal candidiasis (Primary) --Will treat for symptoms with swab pending --Watery discharge but also thick with itching  - terconazole  (TERAZOL 7 ) 0.4 % vaginal cream; Place 1 applicator vaginally at bedtime.  Dispense: 45 g; Refill: 0 - Cervicovaginal ancillary only( [place])  2. Supervision of high risk pregnancy, antepartum --Anticipatory  guidance about next visits/weeks of pregnancy given.   3. History of preterm premature rupture of membranes (PPROM) --Hx PPROM at 30-32 weeks with 34 week delivery --Offered exam/rule out ROM with clear discharge but pt declines, reviewed reasons to seek care  4. Language barrier   5. [redacted] weeks gestation of pregnancy    Preterm labor symptoms and general obstetric precautions including but not limited to vaginal bleeding, contractions, leaking of fluid and fetal movement were reviewed in detail with the patient. Please refer to After Visit Summary for other counseling recommendations.   Return in about 2 weeks (around 10/26/2023) for Female provider preferred.  Future Appointments  Date Time Provider Department Center  10/28/2023 10:35 AM Anyanwu, Gloris LABOR, MD [facility] None    Olam Boards, CNM

## 2023-10-14 LAB — CERVICOVAGINAL ANCILLARY ONLY
Bacterial Vaginitis (gardnerella): NEGATIVE
Candida Glabrata: NEGATIVE
Candida Vaginitis: POSITIVE — AB
Chlamydia: NEGATIVE
Comment: NEGATIVE
Comment: NEGATIVE
Comment: NEGATIVE
Comment: NEGATIVE
Comment: NEGATIVE
Comment: NORMAL
Neisseria Gonorrhea: NEGATIVE
Trichomonas: NEGATIVE

## 2023-10-28 ENCOUNTER — Ambulatory Visit: Admitting: Obstetrics and Gynecology

## 2023-10-28 ENCOUNTER — Encounter: Payer: Self-pay | Admitting: Obstetrics and Gynecology

## 2023-10-28 VITALS — BP 83/68 | HR 92 | Wt 253.0 lb

## 2023-10-28 DIAGNOSIS — Z603 Acculturation difficulty: Secondary | ICD-10-CM

## 2023-10-28 DIAGNOSIS — O09899 Supervision of other high risk pregnancies, unspecified trimester: Secondary | ICD-10-CM

## 2023-10-28 DIAGNOSIS — Z8759 Personal history of other complications of pregnancy, childbirth and the puerperium: Secondary | ICD-10-CM | POA: Diagnosis not present

## 2023-10-28 DIAGNOSIS — Z758 Other problems related to medical facilities and other health care: Secondary | ICD-10-CM

## 2023-10-28 DIAGNOSIS — O99210 Obesity complicating pregnancy, unspecified trimester: Secondary | ICD-10-CM | POA: Diagnosis not present

## 2023-10-28 NOTE — Progress Notes (Signed)
   PRENATAL VISIT NOTE  Subjective:  Marissa Reed is a 28 y.o. H6E8897 at [redacted]w[redacted]d being seen today for ongoing prenatal care.  She is currently monitored for the following issues for this high-risk pregnancy and has Language barrier affecting health care; BMI 34.0-34.9,adult; Supervision of other high risk pregnancies, unspecified trimester; Obesity in pregnancy; and History of preterm premature rupture of membranes (PPROM) on their problem list.  Patient reports no complaints.  Contractions: Not present. Vag. Bleeding: None.  Movement: Present. Denies leaking of fluid.   The following portions of the patient's history were reviewed and updated as appropriate: allergies, current medications, past family history, past medical history, past social history, past surgical history and problem list.   Objective:    Vitals:   10/28/23 1054  BP: (!) 83/68  Pulse: 92  Weight: 253 lb (114.8 kg)    Fetal Status:      Movement: Present    General: Alert, oriented and cooperative. Patient is in no acute distress.  Skin: Skin is warm and dry. No rash noted.   Cardiovascular: Normal heart rate noted  Respiratory: Normal respiratory effort, no problems with respiration noted  Abdomen: Soft, gravid, appropriate for gestational age.  Pain/Pressure: Present     Pelvic: Cervical exam deferred        Extremities: Normal range of motion.  Edema: Trace  Mental Status: Normal mood and affect. Normal behavior. Normal judgment and thought content.   Assessment and Plan:  Pregnancy: G3P1102 at [redacted]w[redacted]d 1. Supervision of other high risk pregnancies, unspecified trimester (Primary) Patient is doing well without complaints Normal growth 6/9 Cultures next visit  2. History of preterm premature rupture of membranes (PPROM)   3. Obesity in pregnancy   4. Language barrier affecting health care Arabic interpreter used  Preterm labor symptoms and general obstetric precautions including but not limited to  vaginal bleeding, contractions, leaking of fluid and fetal movement were reviewed in detail with the patient. Please refer to After Visit Summary for other counseling recommendations.   Return in about 2 weeks (around 11/11/2023) for in person, ROB.  No future appointments.  Winton Felt, MD

## 2023-11-10 ENCOUNTER — Ambulatory Visit (INDEPENDENT_AMBULATORY_CARE_PROVIDER_SITE_OTHER): Admitting: Obstetrics and Gynecology

## 2023-11-10 ENCOUNTER — Encounter: Payer: Self-pay | Admitting: Obstetrics and Gynecology

## 2023-11-10 ENCOUNTER — Other Ambulatory Visit (HOSPITAL_COMMUNITY)
Admission: RE | Admit: 2023-11-10 | Discharge: 2023-11-10 | Disposition: A | Discharge status: Home / Self Care | Source: Ambulatory Visit | Attending: Obstetrics and Gynecology | Admitting: Obstetrics and Gynecology

## 2023-11-10 VITALS — BP 107/65 | HR 110 | Wt 251.2 lb

## 2023-11-10 DIAGNOSIS — Z8759 Personal history of other complications of pregnancy, childbirth and the puerperium: Secondary | ICD-10-CM

## 2023-11-10 DIAGNOSIS — O99210 Obesity complicating pregnancy, unspecified trimester: Secondary | ICD-10-CM | POA: Diagnosis not present

## 2023-11-10 DIAGNOSIS — Z3A36 36 weeks gestation of pregnancy: Secondary | ICD-10-CM | POA: Diagnosis present

## 2023-11-10 DIAGNOSIS — Z603 Acculturation difficulty: Secondary | ICD-10-CM

## 2023-11-10 DIAGNOSIS — O09899 Supervision of other high risk pregnancies, unspecified trimester: Secondary | ICD-10-CM | POA: Diagnosis not present

## 2023-11-10 DIAGNOSIS — Z113 Encounter for screening for infections with a predominantly sexual mode of transmission: Secondary | ICD-10-CM | POA: Insufficient documentation

## 2023-11-10 DIAGNOSIS — Z758 Other problems related to medical facilities and other health care: Secondary | ICD-10-CM

## 2023-11-10 NOTE — Progress Notes (Signed)
   PRENATAL VISIT NOTE  Subjective:  Konya Fauble is a 28 y.o. H6E8897 at [redacted]w[redacted]d being seen today for ongoing prenatal care.  She is currently monitored for the following issues for this low-risk pregnancy and has Language barrier affecting health care; BMI 34.0-34.9,adult; Supervision of other high risk pregnancies, unspecified trimester; Obesity in pregnancy; and History of preterm premature rupture of membranes (PPROM) on their problem list.  Patient reports no complaints.  Contractions: Irritability. Vag. Bleeding: None.  Movement: Present. Denies leaking of fluid.   The following portions of the patient's history were reviewed and updated as appropriate: allergies, current medications, past family history, past medical history, past social history, past surgical history and problem list.   Objective:    Vitals:   11/10/23 1443  BP: 107/65  Pulse: (!) 110  Weight: 113.9 kg    Fetal Status:  Fetal Heart Rate (bpm): 142 Fundal Height: 36 cm Movement: Present    General: Alert, oriented and cooperative. Patient is in no acute distress.  Skin: Skin is warm and dry. No rash noted.   Cardiovascular: Normal heart rate noted  Respiratory: Normal respiratory effort, no problems with respiration noted  Abdomen: Soft, gravid, appropriate for gestational age.  Pain/Pressure: Absent     Pelvic: Cervical exam deferred        Extremities: Normal range of motion.  Edema: None  Mental Status: Normal mood and affect. Normal behavior. Normal judgment and thought content.   Assessment and Plan:  Pregnancy: G3P1102 at [redacted]w[redacted]d 1. Supervision of other high risk pregnancies, unspecified trimester (Primary) Doing well. BP and FHR normal today. Normal us  09/13/23. No follow up needed unless indicated.  2. [redacted] weeks gestation of pregnancy - Culture, beta strep (group b only) - Cervicovaginal ancillary only( [place]) Follow up in 1 week for ROB  3. History of preterm premature rupture of membranes  (PPROM)  4. Obesity in pregnancy  5. Language barrier affecting health care Arabic Interpreter present during this visit.  Term labor symptoms and general obstetric precautions including but not limited to vaginal bleeding, contractions, leaking of fluid and fetal movement were reviewed in detail with the patient. Please refer to After Visit Summary for other counseling recommendations.   Return in about 1 week (around 11/17/2023) for ROB.  No future appointments.  Derrek JINNY Freund, NP Student

## 2023-11-10 NOTE — Progress Notes (Signed)
 36 week labs.   No concerns today.

## 2023-11-11 LAB — CERVICOVAGINAL ANCILLARY ONLY
Chlamydia: NEGATIVE
Comment: NEGATIVE
Comment: NEGATIVE
Comment: NORMAL
Neisseria Gonorrhea: NEGATIVE
Trichomonas: NEGATIVE

## 2023-11-14 ENCOUNTER — Ambulatory Visit: Payer: Self-pay | Admitting: Obstetrics and Gynecology

## 2023-11-14 LAB — CULTURE, BETA STREP (GROUP B ONLY): Strep Gp B Culture: NEGATIVE

## 2023-11-15 ENCOUNTER — Encounter: Admitting: Obstetrics and Gynecology

## 2023-11-21 ENCOUNTER — Inpatient Hospital Stay (HOSPITAL_COMMUNITY)
Admission: AD | Admit: 2023-11-21 | Discharge: 2023-11-21 | Disposition: A | Discharge status: Home / Self Care | Attending: Obstetrics and Gynecology | Admitting: Obstetrics and Gynecology

## 2023-11-21 ENCOUNTER — Encounter (HOSPITAL_COMMUNITY): Payer: Self-pay | Admitting: Obstetrics and Gynecology

## 2023-11-21 DIAGNOSIS — O471 False labor at or after 37 completed weeks of gestation: Secondary | ICD-10-CM | POA: Diagnosis present

## 2023-11-21 DIAGNOSIS — Z3A38 38 weeks gestation of pregnancy: Secondary | ICD-10-CM | POA: Insufficient documentation

## 2023-11-21 DIAGNOSIS — O479 False labor, unspecified: Secondary | ICD-10-CM

## 2023-11-21 NOTE — MAU Note (Signed)
.  Marissa Reed is a 28 y.o. at [redacted]w[redacted]d here in MAU reporting: CTX that started @0100 .  Pt denies LOF or vaginal bleeding, reports +FM. Describes CTX pain as tightening in her lower ABD and pain in lower back.  LMP: na Onset of complaint: 0100 Pain score: 3/10 Vitals:   11/21/23 1628  Resp: 16  Temp: 98 F (36.7 C)     FHT: 143  Lab orders placed from triage: labor eval

## 2023-11-22 ENCOUNTER — Ambulatory Visit: Admitting: Obstetrics and Gynecology

## 2023-11-22 ENCOUNTER — Encounter: Payer: Self-pay | Admitting: Obstetrics and Gynecology

## 2023-11-22 ENCOUNTER — Encounter: Admitting: Obstetrics and Gynecology

## 2023-11-22 VITALS — BP 101/69 | HR 114 | Wt 252.0 lb

## 2023-11-22 DIAGNOSIS — O09899 Supervision of other high risk pregnancies, unspecified trimester: Secondary | ICD-10-CM | POA: Diagnosis not present

## 2023-11-22 DIAGNOSIS — Z603 Acculturation difficulty: Secondary | ICD-10-CM

## 2023-11-22 DIAGNOSIS — Z758 Other problems related to medical facilities and other health care: Secondary | ICD-10-CM | POA: Diagnosis not present

## 2023-11-22 DIAGNOSIS — O99210 Obesity complicating pregnancy, unspecified trimester: Secondary | ICD-10-CM | POA: Diagnosis not present

## 2023-11-22 NOTE — Progress Notes (Signed)
 Pt states she was seen in MAU yesterday for labor eval, had cervical check.  Pt continued to have ctx through the night and this morning. Pt also having increase back pain. Pt states she has had a brownish red d/c since,  would like cervix check again today.

## 2023-11-22 NOTE — Progress Notes (Signed)
   PRENATAL VISIT NOTE  Subjective:  Marissa Reed is a 28 y.o. G3P1102 at [redacted]w[redacted]d being seen today for ongoing prenatal care.  She is currently monitored for the following issues for this low-risk pregnancy and has Language barrier affecting health care; BMI 34.0-34.9,adult; Supervision of other high risk pregnancies, unspecified trimester; Obesity in pregnancy; and History of preterm premature rupture of membranes (PPROM) on their problem list.  Patient reports no complaints.  Contractions: Irregular. Vag. Bleeding: None.  Movement: Present. Denies leaking of fluid.   The following portions of the patient's history were reviewed and updated as appropriate: allergies, current medications, past family history, past medical history, past social history, past surgical history and problem list.   Objective:    Vitals:   11/22/23 0933  BP: 101/69  Pulse: (!) 114  Weight: 252 lb (114.3 kg)    Fetal Status:  Fetal Heart Rate (bpm): 150 Fundal Height: 38 cm Movement: Present Presentation: Vertex  General: Alert, oriented and cooperative. Patient is in no acute distress.  Skin: Skin is warm and dry. No rash noted.   Cardiovascular: Normal heart rate noted  Respiratory: Normal respiratory effort, no problems with respiration noted  Abdomen: Soft, gravid, appropriate for gestational age.  Pain/Pressure: Present     Pelvic: Cervical exam performed in the presence of a chaperone Dilation: 3 Effacement (%): 50 Station: -3  Extremities: Normal range of motion.     Mental Status: Normal mood and affect. Normal behavior. Normal judgment and thought content.   Assessment and Plan:  Pregnancy: G3P1102 at [redacted]w[redacted]d 1. Supervision of other high risk pregnancies, unspecified trimester (Primary) Patient is doing well Very uncomfortable with unchanged cervical exam since yesterday Patient desires IOL at 39 weeks and understands that medical inductions take priority over her   2. Obesity in pregnancy   3.  Language barrier affecting health care Arabic interpreter present  Term labor symptoms and general obstetric precautions including but not limited to vaginal bleeding, contractions, leaking of fluid and fetal movement were reviewed in detail with the patient. Please refer to After Visit Summary for other counseling recommendations.   Return in about 1 week (around 11/29/2023) for in person, ROB, Low risk.  No future appointments.  Winton Felt, MD

## 2023-11-24 ENCOUNTER — Inpatient Hospital Stay (HOSPITAL_COMMUNITY): Admitting: Anesthesiology

## 2023-11-24 ENCOUNTER — Encounter (HOSPITAL_COMMUNITY): Payer: Self-pay | Admitting: Obstetrics and Gynecology

## 2023-11-24 ENCOUNTER — Inpatient Hospital Stay (HOSPITAL_COMMUNITY)
Admission: AD | Admit: 2023-11-24 | Discharge: 2023-11-26 | DRG: 807 | Disposition: A | Discharge status: Home / Self Care | Attending: Obstetrics and Gynecology | Admitting: Obstetrics and Gynecology

## 2023-11-24 ENCOUNTER — Other Ambulatory Visit: Payer: Self-pay

## 2023-11-24 DIAGNOSIS — E66813 Obesity, class 3: Secondary | ICD-10-CM | POA: Diagnosis present

## 2023-11-24 DIAGNOSIS — O99214 Obesity complicating childbirth: Secondary | ICD-10-CM | POA: Diagnosis present

## 2023-11-24 DIAGNOSIS — Z833 Family history of diabetes mellitus: Secondary | ICD-10-CM

## 2023-11-24 DIAGNOSIS — Z6834 Body mass index (BMI) 34.0-34.9, adult: Secondary | ICD-10-CM

## 2023-11-24 DIAGNOSIS — I959 Hypotension, unspecified: Secondary | ICD-10-CM | POA: Diagnosis present

## 2023-11-24 DIAGNOSIS — O09899 Supervision of other high risk pregnancies, unspecified trimester: Principal | ICD-10-CM

## 2023-11-24 DIAGNOSIS — O4292 Full-term premature rupture of membranes, unspecified as to length of time between rupture and onset of labor: Principal | ICD-10-CM | POA: Diagnosis present

## 2023-11-24 DIAGNOSIS — O4290 Premature rupture of membranes, unspecified as to length of time between rupture and onset of labor, unspecified weeks of gestation: Secondary | ICD-10-CM | POA: Diagnosis present

## 2023-11-24 DIAGNOSIS — Z603 Acculturation difficulty: Secondary | ICD-10-CM | POA: Diagnosis present

## 2023-11-24 DIAGNOSIS — Z3A38 38 weeks gestation of pregnancy: Secondary | ICD-10-CM | POA: Diagnosis not present

## 2023-11-24 DIAGNOSIS — Z8759 Personal history of other complications of pregnancy, childbirth and the puerperium: Secondary | ICD-10-CM

## 2023-11-24 DIAGNOSIS — O4202 Full-term premature rupture of membranes, onset of labor within 24 hours of rupture: Secondary | ICD-10-CM | POA: Diagnosis not present

## 2023-11-24 DIAGNOSIS — O26893 Other specified pregnancy related conditions, third trimester: Secondary | ICD-10-CM | POA: Diagnosis present

## 2023-11-24 LAB — COMPREHENSIVE METABOLIC PANEL WITH GFR
ALT: 35 U/L (ref 0–44)
AST: 36 U/L (ref 15–41)
Albumin: 2.5 g/dL — ABNORMAL LOW (ref 3.5–5.0)
Alkaline Phosphatase: 104 U/L (ref 38–126)
Anion gap: 12 (ref 5–15)
BUN: 8 mg/dL (ref 6–20)
CO2: 21 mmol/L — ABNORMAL LOW (ref 22–32)
Calcium: 9.4 mg/dL (ref 8.9–10.3)
Chloride: 102 mmol/L (ref 98–111)
Creatinine, Ser: 0.61 mg/dL (ref 0.44–1.00)
GFR, Estimated: >60 mL/min (ref >60)
Glucose, Bld: 141 mg/dL — ABNORMAL HIGH (ref 70–99)
Potassium: 3.7 mmol/L (ref 3.5–5.1)
Sodium: 135 mmol/L (ref 135–145)
Total Bilirubin: 0.4 mg/dL (ref 0.0–1.2)
Total Protein: 5.9 g/dL — ABNORMAL LOW (ref 6.5–8.1)

## 2023-11-24 LAB — RPR: RPR Ser Ql: NONREACTIVE

## 2023-11-24 LAB — TYPE AND SCREEN
ABO/RH(D): O POS
Antibody Screen: NEGATIVE

## 2023-11-24 LAB — CBC
HCT: 34.9 % — ABNORMAL LOW (ref 36.0–46.0)
HCT: 36.6 % (ref 36.0–46.0)
Hemoglobin: 11.9 g/dL — ABNORMAL LOW (ref 12.0–15.0)
Hemoglobin: 11.8 g/dL — ABNORMAL LOW (ref 12.0–15.0)
MCH: 30.3 pg (ref 26.0–34.0)
MCH: 29.6 pg (ref 26.0–34.0)
MCHC: 34.1 g/dL (ref 30.0–36.0)
MCHC: 32.2 g/dL (ref 30.0–36.0)
MCV: 88.8 fL (ref 80.0–100.0)
MCV: 91.7 fL (ref 80.0–100.0)
Platelets: 224 K/uL (ref 150–400)
Platelets: 202 K/uL (ref 150–400)
RBC: 3.93 MIL/uL (ref 3.87–5.11)
RBC: 3.99 MIL/uL (ref 3.87–5.11)
RDW: 13.8 % (ref 11.5–15.5)
RDW: 14 % (ref 11.5–15.5)
WBC: 7.8 K/uL (ref 4.0–10.5)
WBC: 8.9 K/uL (ref 4.0–10.5)
nRBC: 0 % (ref 0.0–0.2)
nRBC: 0 % (ref 0.0–0.2)

## 2023-11-24 LAB — POCT FERN TEST: POCT Fern Test: POSITIVE

## 2023-11-24 MED ORDER — LIDOCAINE HCL (PF) 1 % IJ SOLN
INTRAMUSCULAR | Status: DC | PRN
  Administered 2023-11-24: 3 mL via SUBCUTANEOUS

## 2023-11-24 MED ORDER — PHENYLEPHRINE 80 MCG/ML (10ML) SYRINGE FOR IV PUSH (FOR BLOOD PRESSURE SUPPORT): 80.0000 ug | PREFILLED_SYRINGE | INTRAVENOUS | Status: DC | PRN

## 2023-11-24 MED ORDER — OXYCODONE-ACETAMINOPHEN 5-325 MG PO TABS: 1.0000 | ORAL_TABLET | ORAL | Status: DC | PRN

## 2023-11-24 MED ORDER — LIDOCAINE-EPINEPHRINE (PF) 2 %-1:200000 IJ SOLN
INTRAMUSCULAR | Status: DC | PRN
  Administered 2023-11-24: 3 mL via EPIDURAL

## 2023-11-24 MED ORDER — EPHEDRINE 5 MG/ML INJ
10.0000 mg | INTRAVENOUS | Status: DC | PRN
  Administered 2023-11-24: 10 mg via INTRAVENOUS

## 2023-11-24 MED ORDER — PHENYLEPHRINE 80 MCG/ML (10ML) SYRINGE FOR IV PUSH (FOR BLOOD PRESSURE SUPPORT)
80.0000 ug | PREFILLED_SYRINGE | INTRAVENOUS | Status: AC | PRN
  Administered 2023-11-24 (×2): 160 ug via INTRAVENOUS

## 2023-11-24 MED ORDER — OXYCODONE-ACETAMINOPHEN 5-325 MG PO TABS: 2.0000 | ORAL_TABLET | ORAL | Status: DC | PRN

## 2023-11-24 MED ORDER — EPHEDRINE 5 MG/ML INJ
10.0000 mg | INTRAVENOUS | Status: DC | PRN
  Administered 2023-11-24: 10 mg via INTRAVENOUS
  Filled 2023-11-24 (×2): qty 5

## 2023-11-24 MED ORDER — FENTANYL-BUPIVACAINE-NACL 0.5-0.125-0.9 MG/250ML-% EP SOLN
12.0000 mL/h | EPIDURAL | Status: DC | PRN
  Administered 2023-11-24: 9 mL/h via EPIDURAL
  Filled 2023-11-24: qty 250

## 2023-11-24 MED ORDER — EPHEDRINE 5 MG/ML INJ
10.0000 mg | INTRAVENOUS | Status: AC | PRN
  Administered 2023-11-24 (×2): 10 mg via INTRAVENOUS

## 2023-11-24 MED ORDER — TERBUTALINE SULFATE 1 MG/ML IJ SOLN: 0.2500 mg | Freq: Once | INTRAMUSCULAR | Status: DC | PRN

## 2023-11-24 MED ORDER — FENTANYL CITRATE (PF) 100 MCG/2ML IJ SOLN: 50.0000 ug | INTRAMUSCULAR | Status: DC | PRN

## 2023-11-24 MED ORDER — SOD CITRATE-CITRIC ACID 500-334 MG/5ML PO SOLN: 30.0000 mL | ORAL | Status: DC | PRN

## 2023-11-24 MED ORDER — ONDANSETRON HCL 4 MG/2ML IJ SOLN: 4.0000 mg | Freq: Four times a day (QID) | INTRAMUSCULAR | Status: DC | PRN

## 2023-11-24 MED ORDER — LIDOCAINE HCL (PF) 1 % IJ SOLN: 30.0000 mL | INTRAMUSCULAR | Status: DC | PRN

## 2023-11-24 MED ORDER — OXYTOCIN-SODIUM CHLORIDE 30-0.9 UT/500ML-% IV SOLN
1.0000 m[IU]/min | INTRAVENOUS | Status: DC
  Administered 2023-11-24: 2 m[IU]/min via INTRAVENOUS
  Filled 2023-11-24 (×2): qty 500

## 2023-11-24 MED ORDER — OXYTOCIN BOLUS FROM INFUSION: 333.0000 mL | Freq: Once | INTRAVENOUS | Status: DC

## 2023-11-24 MED ORDER — LACTATED RINGERS IV SOLN
500.0000 mL | INTRAVENOUS | Status: DC | PRN
  Administered 2023-11-24: 500 mL via INTRAVENOUS

## 2023-11-24 MED ORDER — LACTATED RINGERS IV SOLN: INTRAVENOUS | Status: DC

## 2023-11-24 MED ORDER — LACTATED RINGERS IV SOLN
500.0000 mL | Freq: Once | INTRAVENOUS | Status: AC
  Administered 2023-11-24: 500 mL via INTRAVENOUS

## 2023-11-24 MED ORDER — SODIUM CHLORIDE 0.9 % IV SOLN
INTRAVENOUS | Status: DC | PRN
  Administered 2023-11-24: 5 mL via EPIDURAL

## 2023-11-24 MED ORDER — DIPHENHYDRAMINE HCL 50 MG/ML IJ SOLN
12.5000 mg | INTRAMUSCULAR | Status: DC | PRN
  Administered 2023-11-24: 12.5 mg via INTRAVENOUS
  Filled 2023-11-24: qty 1

## 2023-11-24 MED ORDER — OXYTOCIN-SODIUM CHLORIDE 30-0.9 UT/500ML-% IV SOLN: 2.5000 [IU]/h | INTRAVENOUS | Status: DC

## 2023-11-24 MED ORDER — ACETAMINOPHEN 325 MG PO TABS
650.0000 mg | ORAL_TABLET | ORAL | Status: DC | PRN
  Administered 2023-11-24: 650 mg via ORAL
  Filled 2023-11-24: qty 2

## 2023-11-24 MED ORDER — OXYTOCIN-SODIUM CHLORIDE 30-0.9 UT/500ML-% IV SOLN: 1.0000 m[IU]/min | INTRAVENOUS | Status: DC

## 2023-11-24 MED ORDER — PHENYLEPHRINE 80 MCG/ML (10ML) SYRINGE FOR IV PUSH (FOR BLOOD PRESSURE SUPPORT)
80.0000 ug | PREFILLED_SYRINGE | INTRAVENOUS | Status: AC | PRN
  Administered 2023-11-24 (×3): 80 ug via INTRAVENOUS
  Filled 2023-11-24 (×2): qty 10

## 2023-11-24 NOTE — H&P (Signed)
 OBSTETRIC ADMISSION HISTORY AND PHYSICAL  Marissa Reed is a 28 y.o. female [phone] with IUP at [redacted]w[redacted]d by LMP presenting for SROM 8/19 @ 2300. She reports +FMs, No LOF, no VB, no blurry vision, headaches or peripheral edema, and RUQ pain.  She plans on breast and formula feeding. She is undecided for birth control. She received her prenatal care at [facility]   Dating: By LMP --->  Estimated Date of Delivery: 12/04/23  Sono:   @[redacted]w[redacted]d , CWD, normal female anatomy, cephalic presentation, posterior placental lie, 1265 gm 2 lb 13 oz 40 % EFW   Prenatal History/Complications:  - Language barrier, Arabic - SROM 8/19 @ 2300 - Hx of PPROM 34 weeks - BMI 40  Past Medical History: Past Medical History:  Diagnosis Date   Group B streptococcal bacteriuria 08/14/2020   Will treat now and will need prophylaxis in labor     Medical history non-contributory    Preterm premature rupture of membranes (PPROM) with unknown onset of labor 01/20/2021    Past Surgical History: Past Surgical History:  Procedure Laterality Date   TONSILLECTOMY      Obstetrical History: OB History     Gravida  3   Para  2   Term  1   Preterm  1   AB  0   Living  2      SAB  0   IAB  0   Ectopic  0   Multiple  0   Live Births  2           Social History Social History   Socioeconomic History   Marital status: Married    Spouse name: Not on file   Number of children: Not on file   Years of education: Not on file   Highest education level: Not on file  Occupational History   Not on file  Tobacco Use   Smoking status: Never   Smokeless tobacco: Never  Vaping Use   Vaping status: Never Used  Substance and Sexual Activity   Alcohol use: Never   Drug use: Never   Sexual activity: Yes    Partners: Male    Birth control/protection: None    Comment: undecided  Other Topics Concern   Not on file  Social History Narrative   Not on file   Social Drivers of Health   Financial Resource  Strain: Not on file  Food Insecurity: Not on file  Transportation Needs: Not on file  Physical Activity: Not on file  Stress: Not on file  Social Connections: Unknown (08/19/2021)   Received from [employer]   Social Network    Social Network: Not on file    Family History: Family History  Problem Relation Age of Onset   Diabetes Mother    Diabetes Father    Asthma Neg Hx    Cancer Neg Hx    Heart disease Neg Hx    Hypertension Neg Hx     Allergies: No Known Allergies  Medications Prior to Admission  Medication Sig Dispense Refill Last Dose/Taking   Prenatal Vit-Fe Fumarate-FA (PRENATAL MULTIVITAMIN) TABS tablet Take 1 tablet by mouth daily at 12 noon.   11/23/2023   aspirin  81 MG chewable tablet Chew 1 tablet (81 mg total) by mouth daily. (Patient not taking: No sig reported) 90 tablet 3    ondansetron  (ZOFRAN ) 8 MG tablet Take 1 tablet (8 mg total) by mouth every 8 (eight) hours as needed for nausea or vomiting. (Patient not taking:  Reported on 08/31/2023) 20 tablet 3    ondansetron  (ZOFRAN -ODT) 4 MG disintegrating tablet Take 1 tablet (4 mg total) by mouth every 8 (eight) hours as needed for nausea or vomiting. (Patient not taking: Reported on 06/07/2023) 15 tablet 0      Review of Systems   All systems reviewed and negative except as stated in HPI  Blood pressure (!) 111/57, pulse (!) 101, temperature 98 F (36.7 C), temperature source Oral, resp. rate 12, height 5' 6 (1.676 m), weight 115.8 kg, last menstrual period 02/27/2023, SpO2 100%. General appearance: alert, cooperative, appears stated age, and mild distress Lungs: clear to auscultation bilaterally Heart: regular rate and rhythm Abdomen: soft, non-tender; bowel sounds normal Pelvic: adequate, proven to 3545g Extremities: Homans sign is negative, no sign of DVT DTR's 2+ Presentation: cephalic Fetal monitoringBaseline: 150 bpm, Variability: Good {> 6 bpm), Accelerations: Reactive, and Decelerations:  Absent Uterine activity irregular  Dilation: 3 Exam by:: Nathanel,  RN   Prenatal labs: ABO, Rh: O/Positive/-- (03/03 1058) Antibody: Negative (03/03 1058) Rubella: 1.85 (03/03 1058) RPR: Non Reactive (05/27 0854)  HBsAg: Negative (03/03 1058)  HIV: Non Reactive (05/27 0854)  GBS: Negative/-- (08/06 1530)    Lab Results  Component Value Date   GBS Negative 11/10/2023   Immunization History  Administered Date(s) Administered   Influenza, Quadrivalent, Recombinant, Inj, Pf 01/18/2020   PFIZER(Purple Top)SARS-COV-2 Vaccination 11/29/2019   Tdap 03/05/2019, 12/13/2020    Prenatal Transfer Tool  Maternal Diabetes: No Genetic Screening: Normal Maternal Ultrasounds/Referrals: Normal Fetal Ultrasounds or other Referrals:  None Maternal Substance Abuse:  No Significant Maternal Medications:  None Significant Maternal Lab Results: Group B Strep negative Number of Prenatal Visits:greater than 3 verified prenatal visits Maternal Vaccinations: Declined Other Comments:  None   Results for orders placed or performed during the hospital encounter of 11/24/23 (from the past 24 hours)  CBC   Collection Time: 11/24/23  2:19 AM  Result Value Ref Range   WBC 7.8 4.0 - 10.5 K/uL   RBC 3.93 3.87 - 5.11 MIL/uL   Hemoglobin 11.9 (L) 12.0 - 15.0 g/dL   HCT 65.0 (L) 63.9 - 53.9 %   MCV 88.8 80.0 - 100.0 fL   MCH 30.3 26.0 - 34.0 pg   MCHC 34.1 30.0 - 36.0 g/dL   RDW 86.1 88.4 - 84.4 %   Platelets 224 150 - 400 K/uL   nRBC 0.0 0.0 - 0.2 %    Patient Active Problem List   Diagnosis Date Noted   Obesity in pregnancy 05/10/2023   History of preterm premature rupture of membranes (PPROM) 05/10/2023   Supervision of other high risk pregnancies, unspecified trimester 04/22/2023   BMI 34.0-34.9,adult 08/08/2020   Language barrier affecting health care 08/08/2018    Assessment/Plan:  Marissa Reed is a 28 y.o. [id number] at [redacted]w[redacted]d here for SROM 8/19 @ 2300  #Labor:Pitocin   augmentation #Pain: Per pt request #FWB: Cat I #GBS status:  negative #Feeding: Breastmilk  and Formula #Reproductive Life planning: Undecided #Circ:  not applicable  Marissa Shropshire, MD  11/24/2023, 2:32 AM

## 2023-11-24 NOTE — Anesthesia Procedure Notes (Signed)
 Epidural Patient location during procedure: OB Start time: 11/24/2023 7:35 AM End time: 11/24/2023 7:53 AM  Staffing Anesthesiologist: Boone Fess, MD Performed: anesthesiologist   Preanesthetic Checklist Completed: patient identified, IV checked, site marked, risks and benefits discussed, surgical consent, monitors and equipment checked, pre-op evaluation and timeout performed  Epidural Patient position: sitting Prep: ChloraPrep Patient monitoring: heart rate, continuous pulse ox and blood pressure Approach: midline Location: L3-L4 Injection technique: LOR saline  Needle:  Needle type: Tuohy  Needle gauge: 17 G Needle length: 9 cm Needle insertion depth: 6 cm Catheter type: closed end flexible Catheter size: 19 Gauge Catheter at skin depth: 11 cm Test dose: negative and 1.5% lidocaine  with Epi 1:200 K  Assessment Sensory level: T10 Events: blood not aspirated, no cerebrospinal fluid, injection not painful, no injection resistance, no paresthesia and negative IV test  Additional Notes First/one attempt Pt. Evaluated and documentation done after procedure finished. Patient identified. Risks/Benefits/Options discussed with patient including but not limited to bleeding, infection, nerve damage, paralysis, failed block, incomplete pain control, headache, blood pressure changes, nausea, vomiting, reactions to medication both or allergic, itching and postpartum back pain. Confirmed with bedside nurse the patient's most recent platelet count. Confirmed with patient that they are not currently taking any anticoagulation, have any bleeding history or any family history of bleeding disorders. Patient expressed understanding and wished to proceed. All questions were answered. Sterile technique was used throughout the entire procedure. Please see nursing notes for vital signs. Test dose was given through epidural catheter and negative prior to continuing to dose epidural or start infusion.  Warning signs of high block given to the patient including shortness of breath, tingling/numbness in hands, complete motor block, or any concerning symptoms with instructions to call for help. Patient was given instructions on fall risk and not to get out of bed. All questions and concerns addressed with instructions to call with any issues or inadequate analgesia.     Patient tolerated the insertion well without immediate complications.  Reason for block: procedure for painReason for block:procedure for pain

## 2023-11-24 NOTE — Progress Notes (Signed)
 LABOR PROGRESS NOTE  Patient Name: Marissa Reed, female   DOB: 12-10-1995, 28 y.o.  MRN: [id number]  To bedside to assess bleeding. Bedside RN states heavier bleeding than prior. Scant bleeding on exam, and no active bleeding. IUPC removed and replaced given very low MVU contractions picking up. Cervix 6/90/-3. Babe does not feel asynclitic, and pelvis feels adequate. Will continue to titrate up pit. No s/sx of chorio at this time. Cat I.  #Hypotension: Patient with low baseline BP even prior to labor/epidural. Did require phenylephrine  dosing after epidural placement. Is now back to her baseline. Do not suspect the bleeding she is having is from abruption or causing any of her low Bps. Normal HR as well, and babe Cat I.  Patient's preferred language is Arabic. [employer] translator used during patient interaction.    Almarie CHRISTELLA Moats, MD

## 2023-11-24 NOTE — MAU Note (Addendum)
 Used interpreter- Hazem - [id number] Disconnected - Now Dalya- [id number] Pt says Feels UC's - thinks SROM at 11pm and still feels water running out. - clear.  Denies HSV GBS- neg Was 3 cm when she was here 2 days ago

## 2023-11-24 NOTE — Progress Notes (Addendum)
 Labor Progress Note Marissa Reed is a 28 y.o. H6E8897 at [redacted]w[redacted]d presenting for SROM/PROM  S: Feeling comfortable in terms of contractions. She has a mild headache. Reports I feel like I have been in labor a long time.  O:  BP 114/67   Pulse (!) 103   Temp 98.6 F (37 C) (Oral)   Resp 12   Ht 5' 6 (1.676 m)   Wt 115.8 kg   LMP 02/27/2023 (Exact Date)   SpO2 99%   BMI 41.21 kg/m  Lab Results  Component Value Date   HGB 11.8 (L) 11/24/2023    Time: 8:50 PM  FHT: baseline bpm 145, moderate variability, accelerations present, decelerations none,   Contractions: q MVUs 180    CVE: Dilation: 6 Effacement (%): 80 Station: -3 Presentation: Vertex Exam by:: Davis MD   A&P: 28 y.o. [id number] [redacted]w[redacted]d admitted for SROM #Labor: Active Labor Pitocin  IUPC in place, continue titrating pitocin  #Pain: Epidural #FWB: Category I #GBS negative Obesity (chronic/other problems)  Leeroy KATHEE Pouch, MD 8:50 PM

## 2023-11-24 NOTE — Anesthesia Preprocedure Evaluation (Signed)
 Anesthesia Evaluation  Patient identified by MRN, date of birth, ID band Patient awake    Reviewed: Allergy & Precautions, NPO status , Patient's Chart, lab work & pertinent test results  History of Anesthesia Complications Negative for: history of anesthetic complications  Airway Mallampati: II  TM Distance: >3 FB Neck ROM: Full    Dental no notable dental hx. (+) Teeth Intact   Pulmonary neg pulmonary ROS, neg sleep apnea, neg COPD, Patient abstained from smoking.Not current smoker   Pulmonary exam normal breath sounds clear to auscultation       Cardiovascular Exercise Tolerance: Good METS(-) hypertension(-) CAD and (-) Past MI negative cardio ROS (-) dysrhythmias  Rhythm:Regular Rate:Normal - Systolic murmurs    Neuro/Psych negative neurological ROS  negative psych ROS   GI/Hepatic ,neg GERD  ,,(+)     (-) substance abuse    Endo/Other  neg diabetes  Class 3 obesity  Renal/GU negative Renal ROS     Musculoskeletal   Abdominal   Peds  Hematology Denies blood thinner use or bleeding disorders.    Anesthesia Other Findings Denies blood thinner use or bleeding diatheses. Recent labs reviewed. Past Medical History: 08/14/2020: Group B streptococcal bacteriuria     Comment:  Will treat now and will need prophylaxis in labor   No date: Medical history non-contributory 01/20/2021: Preterm premature rupture of membranes (PPROM) with  unknown onset of labor   Reproductive/Obstetrics (+) Pregnancy                              Anesthesia Physical Anesthesia Plan  ASA: 2  Anesthesia Plan: Epidural   Post-op Pain Management:    Induction:   PONV Risk Score and Plan: 3 and Treatment may vary due to age or medical condition and Ondansetron   Airway Management Planned: Natural Airway  Additional Equipment:   Intra-op Plan:   Post-operative Plan:   Informed Consent: I have  reviewed the patients History and Physical, chart, labs and discussed the procedure including the risks, benefits and alternatives for the proposed anesthesia with the patient or authorized representative who has indicated his/her understanding and acceptance.     Interpreter used for interview  Plan Discussed with: Surgeon  Anesthesia Plan Comments: (Discussed R/B/A of neuraxial anesthesia technique with patient: - rare risks of spinal/epidural hematoma, nerve damage, infection - Risk of PDPH - Risk of itching - Risk of nausea and vomiting - Risk of poor block necessitating replacement of epidural. - Risk of allergic reactions. Patient voiced understanding.)        Anesthesia Quick Evaluation

## 2023-11-24 NOTE — Progress Notes (Signed)
 LABOR PROGRESS NOTE Pt rechecked at 1530 Pit at 18 SCE: 6/75/-2 IUPC placed, blood returned FHT: baseline 150, mod variability, +accels, -decels; overall category I. Toco: q2 min, inadequate by external monitor  A/P:  Continue pit per protcol CTM  Barabara Maier, DO 4:16 PM

## 2023-11-25 ENCOUNTER — Encounter (HOSPITAL_COMMUNITY): Payer: Self-pay | Admitting: Obstetrics and Gynecology

## 2023-11-25 DIAGNOSIS — O4202 Full-term premature rupture of membranes, onset of labor within 24 hours of rupture: Secondary | ICD-10-CM

## 2023-11-25 DIAGNOSIS — Z3A38 38 weeks gestation of pregnancy: Secondary | ICD-10-CM

## 2023-11-25 LAB — CBC
HCT: 32.4 % — ABNORMAL LOW (ref 36.0–46.0)
Hemoglobin: 10.9 g/dL — ABNORMAL LOW (ref 12.0–15.0)
MCH: 29.9 pg (ref 26.0–34.0)
MCHC: 33.6 g/dL (ref 30.0–36.0)
MCV: 88.8 fL (ref 80.0–100.0)
Platelets: 201 K/uL (ref 150–400)
RBC: 3.65 MIL/uL — ABNORMAL LOW (ref 3.87–5.11)
RDW: 14 % (ref 11.5–15.5)
WBC: 9.7 K/uL (ref 4.0–10.5)
nRBC: 0 % (ref 0.0–0.2)

## 2023-11-25 MED ORDER — DIBUCAINE (PERIANAL) 1 % EX OINT: 1.0000 | TOPICAL_OINTMENT | CUTANEOUS | Status: DC | PRN

## 2023-11-25 MED ORDER — ONDANSETRON HCL 4 MG PO TABS: 4.0000 mg | ORAL_TABLET | ORAL | Status: DC | PRN

## 2023-11-25 MED ORDER — ONDANSETRON HCL 4 MG/2ML IJ SOLN: 4.0000 mg | INTRAMUSCULAR | Status: DC | PRN

## 2023-11-25 MED ORDER — SIMETHICONE 80 MG PO CHEW: 80.0000 mg | CHEWABLE_TABLET | ORAL | Status: DC | PRN

## 2023-11-25 MED ORDER — IBUPROFEN 600 MG PO TABS
600.0000 mg | ORAL_TABLET | Freq: Four times a day (QID) | ORAL | Status: DC
  Administered 2023-11-25 – 2023-11-26 (×6): 600 mg via ORAL
  Filled 2023-11-25 (×6): qty 1

## 2023-11-25 MED ORDER — DIPHENHYDRAMINE HCL 25 MG PO CAPS: 25.0000 mg | ORAL_CAPSULE | Freq: Four times a day (QID) | ORAL | Status: DC | PRN

## 2023-11-25 MED ORDER — WITCH HAZEL-GLYCERIN EX PADS: 1.0000 | MEDICATED_PAD | CUTANEOUS | Status: DC | PRN

## 2023-11-25 MED ORDER — ZOLPIDEM TARTRATE 5 MG PO TABS: 5.0000 mg | ORAL_TABLET | Freq: Every evening | ORAL | Status: DC | PRN

## 2023-11-25 MED ORDER — TETANUS-DIPHTH-ACELL PERTUSSIS 5-2.5-18.5 LF-MCG/0.5 IM SUSY: 0.5000 mL | PREFILLED_SYRINGE | Freq: Once | INTRAMUSCULAR | Status: DC

## 2023-11-25 MED ORDER — COCONUT OIL OIL: 1.0000 | TOPICAL_OIL | Status: DC | PRN

## 2023-11-25 MED ORDER — ACETAMINOPHEN 325 MG PO TABS
650.0000 mg | ORAL_TABLET | ORAL | Status: DC | PRN
  Administered 2023-11-25: 650 mg via ORAL
  Filled 2023-11-25: qty 2

## 2023-11-25 MED ORDER — OXYCODONE HCL 5 MG PO TABS: 5.0000 mg | ORAL_TABLET | ORAL | Status: DC | PRN

## 2023-11-25 MED ORDER — SENNOSIDES-DOCUSATE SODIUM 8.6-50 MG PO TABS
2.0000 | ORAL_TABLET | Freq: Every day | ORAL | Status: DC
  Administered 2023-11-26: 2 via ORAL
  Filled 2023-11-25: qty 2

## 2023-11-25 MED ORDER — PRENATAL MULTIVITAMIN CH
1.0000 | ORAL_TABLET | Freq: Every day | ORAL | Status: DC
  Administered 2023-11-25 – 2023-11-26 (×2): 1 via ORAL
  Filled 2023-11-25 (×2): qty 1

## 2023-11-25 MED ORDER — OXYCODONE HCL 5 MG PO TABS: 10.0000 mg | ORAL_TABLET | ORAL | Status: DC | PRN

## 2023-11-25 MED ORDER — BENZOCAINE-MENTHOL 20-0.5 % EX AERO: 1.0000 | INHALATION_SPRAY | CUTANEOUS | Status: DC | PRN

## 2023-11-25 NOTE — Lactation Note (Signed)
 This note was copied from a baby's chart. Lactation Consultation Note  Patient Name: Marissa Reed Unijb'd Date: 11/25/2023 Age:26 hours Reason for consult:  (per MBU nurse mom declines LC entire stay)      Feeding Mother's Current Feeding Choice: Breast Milk and Formula Nipple Type: Extra Slow Flow  Consult Status Consult Status: Complete Date: 11/25/23    [name] 11/25/2023, 8:58 AM

## 2023-11-25 NOTE — Anesthesia Postprocedure Evaluation (Signed)
 Anesthesia Post Note  Patient: Jaileigh Clavel  Procedure(s) Performed: AN AD HOC LABOR EPIDURAL     Patient location during evaluation: Mother Baby Anesthesia Type: Epidural Level of consciousness: awake, awake and alert and oriented Pain management: satisfactory to patient Vital Signs Assessment: post-procedure vital signs reviewed and stable Respiratory status: spontaneous breathing, nonlabored ventilation and respiratory function stable Cardiovascular status: blood pressure returned to baseline and stable Postop Assessment: no headache, no backache, no apparent nausea or vomiting, patient able to bend at knees, adequate PO intake and able to ambulate Anesthetic complications: no   No notable events documented.  Last Vitals:  Vitals:   11/25/23 1130 11/25/23 1530  BP: 100/71 119/77  Pulse: 64 65  Resp: 18 18  Temp: 36.7 C 36.9 C  SpO2: 100% 100%    Last Pain:  Vitals:   11/25/23 1530  TempSrc: Oral  PainSc:    Pain Goal: Patients Stated Pain Goal: 0 (11/24/23 0716)                 Madalyne Husk

## 2023-11-25 NOTE — Discharge Summary (Incomplete)
 Postpartum Discharge Summary  Date of Service updated***     Patient Name: Marissa Reed DOB: August 02, 1995 MRN: [id number]  Date of admission: 11/24/2023 Delivery date:11/25/2023 Delivering provider: TRUDY CZAR B Date of discharge: 11/25/2023  Admitting diagnosis: Leakage of amniotic fluid [O42.90] Intrauterine pregnancy: [redacted]w[redacted]d     Secondary diagnosis:  Active Problems:   Language barrier affecting health care   BMI 34.0-34.9,adult   Spontaneous vaginal delivery  Additional problems: none    Discharge diagnosis: Term Pregnancy Delivered                                              Post partum procedures:{Postpartum procedures:23558} Augmentation: Pitocin  Complications: None  Hospital course: SROM With Augmentation and Vaginal Delivery      28 y.o. yo [id number] at [redacted]w[redacted]d was admitted in Latent Labor on 11/24/2023. Labor course was uncomplicated  Membrane Rupture Time/Date: 11:00 PM,11/23/2023  Delivery Method:Vaginal, Spontaneous Operative Delivery:N/A Episiotomy:   Lacerations:  1st degree;Perineal Patient had an uncomplicated postpartum course.  She is ambulating, tolerating a regular diet, passing flatus, and urinating well. Patient is discharged home in stable condition on 11/25/23.  Newborn Data: Birth date:11/25/2023 Birth time:12:19 AM Gender:Female Living status:  Apgars: ,  Weight:   Magnesium Sulfate received: No BMZ received: No Rhophylac:No MMR:N/A T-DaP:*** Flu: No RSV Vaccine received: No Transfusion:No  Immunizations received: Immunization History  Administered Date(s) Administered   Influenza, Quadrivalent, Recombinant, Inj, Pf 01/18/2020   PFIZER(Purple Top)SARS-COV-2 Vaccination 11/29/2019   Tdap 03/05/2019, 12/13/2020    Physical exam  Vitals:   11/24/23 2317 11/24/23 2330 11/24/23 2347 11/25/23 0039  BP: (!) 96/56 (!) 94/57 (!) 98/56 (!) 93/50  Pulse: 93 90 90 91  Resp:      Temp:      TempSrc:      SpO2:  99%    Weight:       Height:       General: {Exam; general:21111117} Lochia: {Desc; appropriate/inappropriate:30686::appropriate} Uterine Fundus: {Desc; firm/soft:30687} Incision: {Exam; incision:21111123} DVT Evaluation: {Exam; icu:7888877} Labs: Lab Results  Component Value Date   WBC 8.9 11/24/2023   HGB 11.8 (L) 11/24/2023   HCT 36.6 11/24/2023   MCV 91.7 11/24/2023   PLT 202 11/24/2023      Latest Ref Rng & Units 11/24/2023    2:19 AM  CMP  Glucose 70 - 99 mg/dL 858   BUN 6 - 20 mg/dL 8   Creatinine 9.55 - 8.99 mg/dL 9.38   Sodium 864 - 854 mmol/L 135   Potassium 3.5 - 5.1 mmol/L 3.7   Chloride 98 - 111 mmol/L 102   CO2 22 - 32 mmol/L 21   Calcium  8.9 - 10.3 mg/dL 9.4   Total Protein 6.5 - 8.1 g/dL 5.9   Total Bilirubin 0.0 - 1.2 mg/dL 0.4   Alkaline Phos 38 - 126 U/L 104   AST 15 - 41 U/L 36   ALT 0 - 44 U/L 35    Edinburgh Score:    01/25/2021   12:42 PM  Edinburgh Postnatal Depression Scale Screening Tool  I have been able to laugh and see the funny side of things. 0  I have looked forward with enjoyment to things. 0  I have blamed myself unnecessarily when things went wrong. 0  I have been anxious or worried for no good reason. 0  I have felt scared  or panicky for no good reason. 0  Things have been getting on top of me. 0  I have been so unhappy that I have had difficulty sleeping. 0  I have felt sad or miserable. 0  I have been so unhappy that I have been crying. 0  The thought of harming myself has occurred to me. 0  Edinburgh Postnatal Depression Scale Total 0      Data saved with a previous flowsheet row definition   No data recorded  After visit meds:  Allergies as of 11/25/2023   No Known Allergies   Med Rec must be completed prior to using this [facility]***        Discharge home in stable condition Infant Feeding: Bottle and Breast Infant Disposition:home with mother Discharge instruction: per After Visit Summary and Postpartum booklet. Activity:  Advance as tolerated. Pelvic rest for 6 weeks.  Diet: routine diet Future Appointments: Future Appointments  Date Time Provider Department Center  11/29/2023  9:35 AM Delores Nidia CROME, FNP [facility] None   Follow up Visit:   Please schedule this patient for a In person postpartum visit in 4 weeks with the following provider: Any provider. Additional Postpartum F/U:none  Low risk pregnancy complicated by: none Delivery mode:  Vaginal, Spontaneous Anticipated Birth Control:  Unsure   11/25/2023 Leeroy KATHEE Pouch, MD

## 2023-11-25 NOTE — Plan of Care (Signed)

## 2023-11-26 MED ORDER — ACETAMINOPHEN 325 MG PO TABS: 650.0000 mg | ORAL_TABLET | ORAL | Status: AC | PRN

## 2023-11-26 MED ORDER — IBUPROFEN 600 MG PO TABS: 600.0000 mg | ORAL_TABLET | Freq: Four times a day (QID) | ORAL | 0 refills | Status: AC

## 2023-11-26 NOTE — Patient Instructions (Signed)

## 2023-11-26 NOTE — Discharge Summary (Signed)
 Postpartum Discharge Summary       Patient Name: Marissa Reed DOB: [birth year] MRN: [id number]  Date of admission: 11/24/2023 Delivery date:11/25/2023 Delivering provider: TRUDY CZAR Reed Date of discharge: 11/26/2023  Admitting diagnosis: Leakage of amniotic fluid [O42.90] Intrauterine pregnancy: [redacted]w[redacted]d     Secondary diagnosis:  Active Problems:   Language barrier affecting health care   BMI 34.0-34.9,adult   Spontaneous vaginal delivery  Additional problems: none    Discharge diagnosis: Term Pregnancy Delivered                                              Post partum procedures:none Augmentation: Pitocin  Complications: None  Hospital course: SROM With Augmentation and Vaginal Delivery      28 y.o. yo [id number] at [redacted]w[redacted]d was admitted in Latent Labor on 11/24/2023. Labor course was uncomplicated  Membrane Rupture Time/Date: 11:00 PM,11/23/2023  Delivery Method:Vaginal, Spontaneous Operative Delivery:N/A Episiotomy:   Lacerations:  1st degree;Perineal Patient had an uncomplicated postpartum course.  She is ambulating, tolerating a regular diet, passing flatus, and urinating well. Patient is discharged home in stable condition on 11/26/23.  Newborn Data: Birth date:11/25/2023 Birth time:12:19 AM Gender:Female Living status:Living Apgars:8 ,10  Weight:3459 g  Magnesium Sulfate received: No BMZ received: No Rhophylac:No MMR:N/A T-DaP:given prenatally Flu: No RSV Vaccine received: No Transfusion:No  Immunizations received: Immunization History  Administered Date(s) Administered   Influenza, Quadrivalent, Recombinant, Inj, Pf 01/18/2020   PFIZER(Purple Top)SARS-COV-2 Vaccination 11/29/2019   Tdap 03/05/2019, 12/13/2020    Physical exam  Vitals:   11/25/23 1130 11/25/23 1530 11/25/23 2052 11/26/23 0554  BP: 100/71 119/77 96/63 95/64   Pulse: 64 65 74 72  Resp: 18 18 18 18   Temp: 98 F (36.7 C) 98.4 F (36.9 C) 98.3 F (36.8 C) 97.6 F (36.4 C)  TempSrc: Oral  Oral Oral Oral  SpO2: 100% 100% 100% 100%  Weight:      Height:       General: alert, cooperative, and no distress Lochia: appropriate Uterine Fundus: firm Incision: N/A DVT Evaluation: No evidence of DVT seen on physical exam. Negative Homan's sign. No cords or calf tenderness. No significant calf/ankle edema. Labs: Lab Results  Component Value Date   WBC 9.7 11/25/2023   HGB 10.9 (L) 11/25/2023   HCT 32.4 (L) 11/25/2023   MCV 88.8 11/25/2023   PLT 201 11/25/2023      Latest Ref Rng & Units 11/24/2023    2:19 AM  CMP  Glucose 70 - 99 mg/dL 858   BUN 6 - 20 mg/dL 8   Creatinine 9.55 - 8.99 mg/dL 9.38   Sodium 864 - 854 mmol/L 135   Potassium 3.5 - 5.1 mmol/L 3.7   Chloride 98 - 111 mmol/L 102   CO2 22 - 32 mmol/L 21   Calcium  8.9 - 10.3 mg/dL 9.4   Total Protein 6.5 - 8.1 g/dL 5.9   Total Bilirubin 0.0 - 1.2 mg/dL 0.4   Alkaline Phos 38 - 126 U/L 104   AST 15 - 41 U/L 36   ALT 0 - 44 U/L 35    Edinburgh Score:    11/25/2023    3:50 AM  Edinburgh Postnatal Depression Scale Screening Tool  I have been able to laugh and see the funny side of things. 0  I have looked forward with enjoyment to things. 0  I have blamed  myself unnecessarily when things went wrong. 0  I have been anxious or worried for no good reason. 0  I have felt scared or panicky for no good reason. 0  Things have been getting on top of me. 0  I have been so unhappy that I have had difficulty sleeping. 0  I have felt sad or miserable. 0  I have been so unhappy that I have been crying. 0  The thought of harming myself has occurred to me. 0  Edinburgh Postnatal Depression Scale Total 0   Edinburgh Postnatal Depression Scale Total: 0   After visit meds:  Allergies as of 11/26/2023   No Known Allergies      Medication List     STOP taking these medications    aspirin  81 MG chewable tablet   ondansetron  4 MG disintegrating tablet Commonly known as: ZOFRAN -ODT   ondansetron  8 MG  tablet Commonly known as: ZOFRAN        TAKE these medications    acetaminophen  325 MG tablet Commonly known as: Tylenol  Take 2 tablets (650 mg total) by mouth every 4 (four) hours as needed (for pain scale < 4).   ibuprofen  600 MG tablet Commonly known as: ADVIL  Take 1 tablet (600 mg total) by mouth every 6 (six) hours.   prenatal multivitamin Tabs tablet Take 1 tablet by mouth daily at 12 noon.         Discharge home in stable condition Infant Feeding: Bottle and Breast Infant Disposition:home with mother Discharge instruction: per After Visit Summary and Postpartum booklet. Activity: Advance as tolerated. Pelvic rest for 6 weeks.  Diet: routine diet Future Appointments: Future Appointments  Date Time Provider Department Center  11/29/2023  9:35 AM Marissa Nidia CROME, FNP [facility] None   Follow up Visit:   Please schedule this patient for a In person postpartum visit in 4 weeks with the following provider: Any provider. Additional Postpartum F/U:none  Low risk pregnancy complicated by: none Delivery mode:  Vaginal, Spontaneous Anticipated Birth Control:  Unsure   11/26/2023 Marissa Reed, CNM

## 2023-11-26 NOTE — Progress Notes (Signed)
 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

## 2023-11-29 ENCOUNTER — Encounter: Admitting: Obstetrics and Gynecology

## 2023-12-07 ENCOUNTER — Telehealth (HOSPITAL_COMMUNITY): Payer: Self-pay | Admitting: *Deleted

## 2023-12-07 NOTE — Telephone Encounter (Signed)
 12/07/2023  Name: Marissa Reed MRN: [id number] DOB: [birth year]  Reason for Call:  Transition of Care Hospital Discharge Call  Contact Status: Patient Contact Status: Message  Language assistant needed: Interpreter Mode: Telephonic Interpreter Interpreter Name: Avelina [id number]        Follow-Up Questions:    [place] Postnatal Depression Scale:  In the Past 7 Days:    PHQ2-9 Depression Scale:     Discharge Follow-up:    Post-discharge interventions: NA  Mliss Sieve, RN 12/07/2023 10:53

## 2023-12-11 ENCOUNTER — Inpatient Hospital Stay (HOSPITAL_COMMUNITY)

## 2023-12-11 ENCOUNTER — Inpatient Hospital Stay (HOSPITAL_COMMUNITY): Admission: AD | Admit: 2023-12-11 | Source: Home / Self Care | Admitting: Obstetrics and Gynecology

## 2024-01-11 ENCOUNTER — Ambulatory Visit: Admitting: Obstetrics and Gynecology

## 2024-01-17 ENCOUNTER — Ambulatory Visit: Admitting: Obstetrics

## 2024-01-31 ENCOUNTER — Ambulatory Visit: Admitting: Obstetrics and Gynecology
